# Patient Record
Sex: Female | Born: 1950 | Race: White | Hispanic: No | Marital: Married | Smoking: Former smoker
Health system: Southern US, Community
[De-identification: ages and names within clinical notes are randomized; demographics above are authoritative.]

## PROBLEM LIST (undated history)

## (undated) DIAGNOSIS — E7800 Pure hypercholesterolemia, unspecified: Secondary | ICD-10-CM

## (undated) DIAGNOSIS — F419 Anxiety disorder, unspecified: Secondary | ICD-10-CM

## (undated) DIAGNOSIS — K589 Irritable bowel syndrome without diarrhea: Secondary | ICD-10-CM

## (undated) DIAGNOSIS — D649 Anemia, unspecified: Secondary | ICD-10-CM

## (undated) DIAGNOSIS — K5909 Other constipation: Secondary | ICD-10-CM

## (undated) DIAGNOSIS — M069 Rheumatoid arthritis, unspecified: Secondary | ICD-10-CM

## (undated) DIAGNOSIS — M797 Fibromyalgia: Secondary | ICD-10-CM

## (undated) DIAGNOSIS — K219 Gastro-esophageal reflux disease without esophagitis: Secondary | ICD-10-CM

## (undated) DIAGNOSIS — J189 Pneumonia, unspecified organism: Secondary | ICD-10-CM

## (undated) DIAGNOSIS — Z8489 Family history of other specified conditions: Secondary | ICD-10-CM

## (undated) DIAGNOSIS — M8580 Other specified disorders of bone density and structure, unspecified site: Secondary | ICD-10-CM

## (undated) DIAGNOSIS — R112 Nausea with vomiting, unspecified: Secondary | ICD-10-CM

## (undated) DIAGNOSIS — Z9889 Other specified postprocedural states: Secondary | ICD-10-CM

## (undated) DIAGNOSIS — G2581 Restless legs syndrome: Secondary | ICD-10-CM

## (undated) HISTORY — PX: COLONOSCOPY: SHX174

## (undated) HISTORY — DX: Other specified disorders of bone density and structure, unspecified site: M85.80

## (undated) HISTORY — PX: FOOT SURGERY: SHX648

## (undated) HISTORY — PX: COLON SURGERY: SHX602

## (undated) HISTORY — PX: BREAST SURGERY: SHX581

## (undated) HISTORY — DX: Irritable bowel syndrome, unspecified: K58.9

## (undated) HISTORY — PX: CATARACT EXTRACTION W/ INTRAOCULAR LENS IMPLANT: SHX1309

## (undated) HISTORY — PX: CARPAL TUNNEL RELEASE: SHX101

## (undated) HISTORY — PX: CARDIAC CATHETERIZATION: SHX172

## (undated) HISTORY — PX: ABDOMINAL HYSTERECTOMY: SHX81

## (undated) HISTORY — PX: ABDOMINAL ADHESION SURGERY: SHX90

---

## 1997-12-08 ENCOUNTER — Emergency Department (HOSPITAL_COMMUNITY): Admission: AD | Admit: 1997-12-08 | Discharge: 1997-12-08 | Payer: Self-pay | Admitting: Emergency Medicine

## 1997-12-26 ENCOUNTER — Other Ambulatory Visit: Admission: RE | Admit: 1997-12-26 | Discharge: 1997-12-26 | Payer: Self-pay | Admitting: Obstetrics and Gynecology

## 1998-02-15 ENCOUNTER — Ambulatory Visit (HOSPITAL_COMMUNITY): Admission: RE | Admit: 1998-02-15 | Discharge: 1998-02-15 | Payer: Self-pay | Admitting: *Deleted

## 1998-02-21 ENCOUNTER — Ambulatory Visit (HOSPITAL_COMMUNITY): Admission: RE | Admit: 1998-02-21 | Discharge: 1998-02-21 | Payer: Self-pay | Admitting: *Deleted

## 1998-05-11 ENCOUNTER — Inpatient Hospital Stay (HOSPITAL_COMMUNITY): Admission: RE | Admit: 1998-05-11 | Discharge: 1998-05-15 | Payer: Self-pay | Admitting: *Deleted

## 1999-06-19 ENCOUNTER — Encounter: Payer: Self-pay | Admitting: Family Medicine

## 1999-06-19 ENCOUNTER — Ambulatory Visit (HOSPITAL_COMMUNITY): Admission: RE | Admit: 1999-06-19 | Discharge: 1999-06-19 | Payer: Self-pay | Admitting: Family Medicine

## 2003-07-11 ENCOUNTER — Ambulatory Visit (HOSPITAL_COMMUNITY): Admission: RE | Admit: 2003-07-11 | Discharge: 2003-07-11 | Payer: Self-pay | Admitting: Orthopedic Surgery

## 2004-03-13 ENCOUNTER — Ambulatory Visit (HOSPITAL_BASED_OUTPATIENT_CLINIC_OR_DEPARTMENT_OTHER): Admission: RE | Admit: 2004-03-13 | Discharge: 2004-03-13 | Payer: Self-pay | Admitting: Orthopedic Surgery

## 2006-04-03 ENCOUNTER — Encounter: Admission: RE | Admit: 2006-04-03 | Discharge: 2006-06-03 | Payer: Self-pay | Admitting: Orthopedic Surgery

## 2006-09-16 ENCOUNTER — Other Ambulatory Visit: Admission: RE | Admit: 2006-09-16 | Discharge: 2006-09-16 | Payer: Self-pay | Admitting: Obstetrics and Gynecology

## 2006-10-09 ENCOUNTER — Encounter: Admission: RE | Admit: 2006-10-09 | Discharge: 2006-10-09 | Payer: Self-pay | Admitting: Surgery

## 2006-11-26 ENCOUNTER — Encounter: Payer: Self-pay | Admitting: Emergency Medicine

## 2006-11-26 ENCOUNTER — Inpatient Hospital Stay (HOSPITAL_COMMUNITY): Admission: AD | Admit: 2006-11-26 | Discharge: 2006-12-03 | Payer: Self-pay | Admitting: *Deleted

## 2007-01-22 ENCOUNTER — Ambulatory Visit: Payer: Self-pay | Admitting: Vascular Surgery

## 2007-03-11 ENCOUNTER — Ambulatory Visit: Admission: RE | Admit: 2007-03-11 | Discharge: 2007-03-11 | Payer: Self-pay | Admitting: Family Medicine

## 2008-04-04 IMAGING — CT CT ENTERO ABD W/CM
1 of 5 series · 11 of 32 positions shown, 17 images · IV contrast (VOLUMEN & [ID] OMNI 300)
Comparison: none

CLINICAL DATA: Possible small bowel obstruction.  Severe constipation.  Prior hysterectomy.  Surgery for adhesions.
 CT ENTEROGRAPHY ABDOMEN:
TECHNIQUE: Multidetector CT imaging of the abdomen was performed following administration of 4382 cc of VoLumen low attenuation enteric contrast.  Images were acquired after bolus injection of nonionic intravenous contrast with coronal and sagittal reformations reviewed for assessment of the small bowel mucosa. 
 Contrast:  100 cc Omnipaque 300.  Also patient was given VoLumen for negative enteric contrast.
TECHNIQUE: Multidetector CT imaging of the pelvis was performed following administration of 4382 cc of VoLumen low attenuation enteric contrast.  Images were acquired after bolus injection of nonionic intravenous contrast with coronal and sagittal reformations reviewed for assessment of the small bowel mucosa.

[Series 2: enterography study · axial · 0.74mm/px · z∈[-453,-21]mm · 11 of 203 slices shown, 17 images]
[im 17/203  soft-tissue]
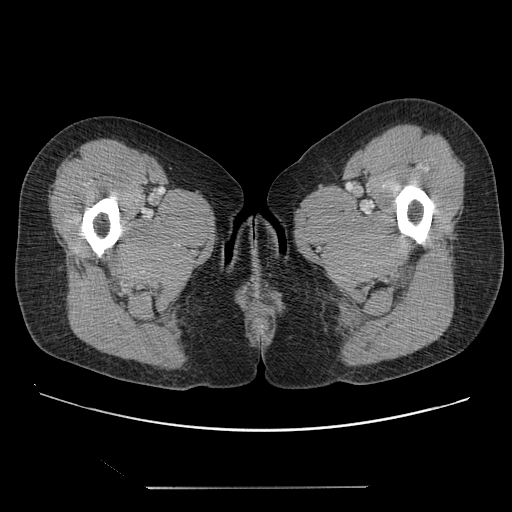
[im 17/203  bone]
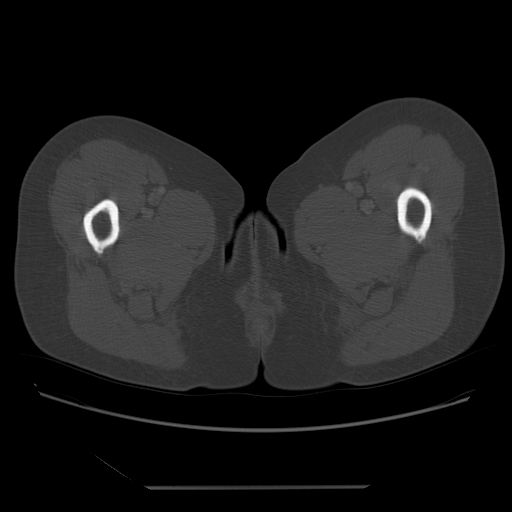
[im 34/203  soft-tissue]
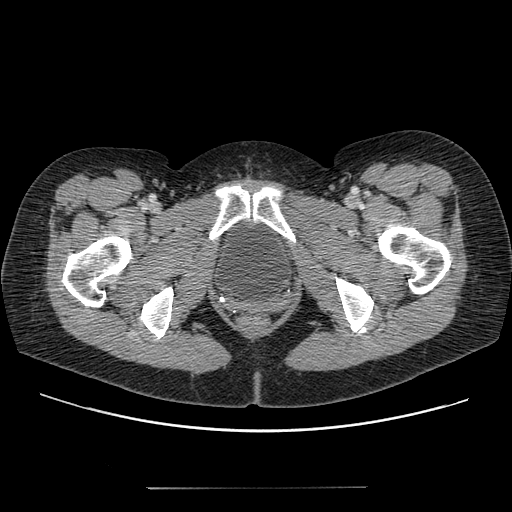
[im 51/203  soft-tissue]
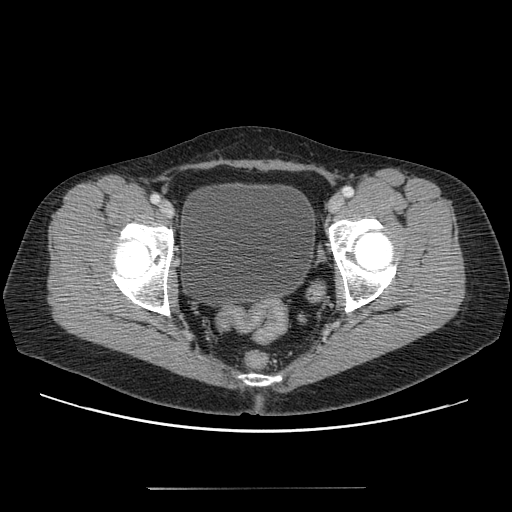
[im 68/203  soft-tissue]
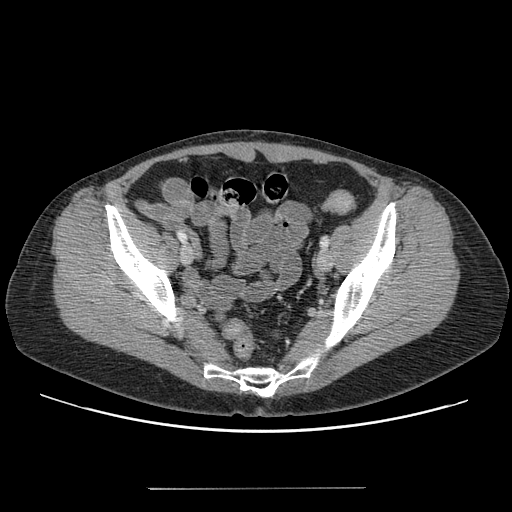
[im 85/203  soft-tissue]
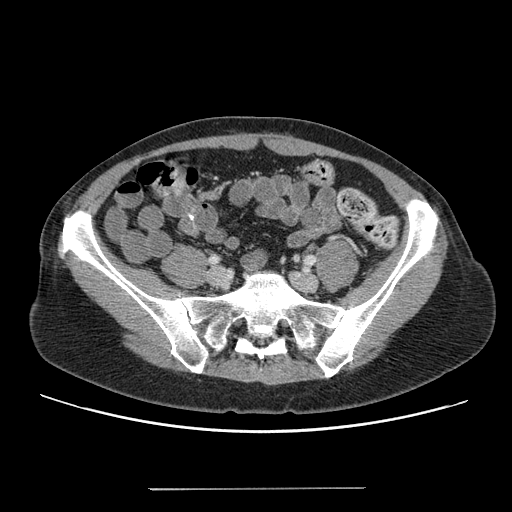
[im 102/203  soft-tissue]
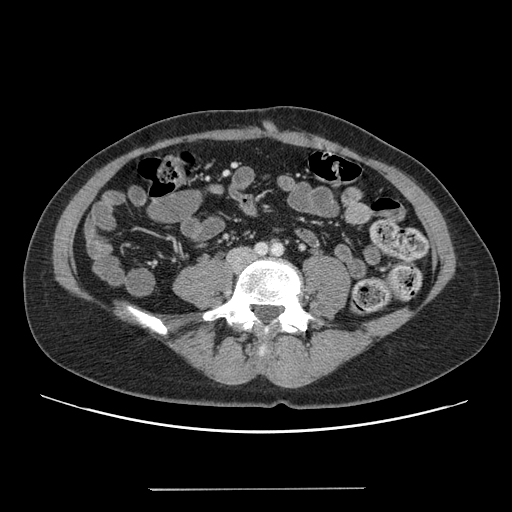
[im 118/203  soft-tissue]
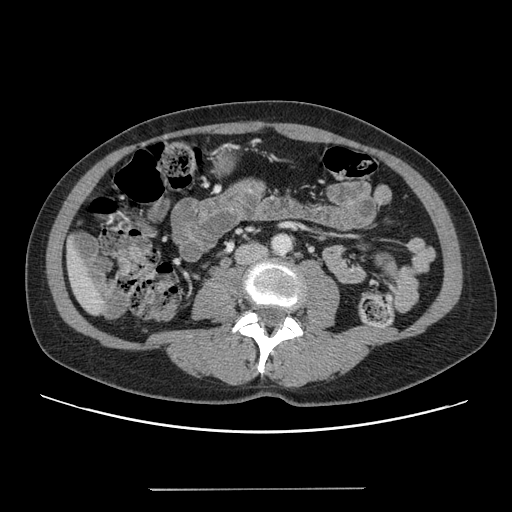
[im 135/203  soft-tissue]
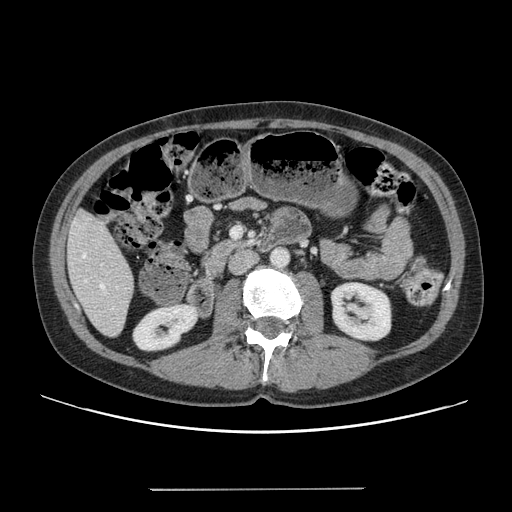
[im 135/203  lung]
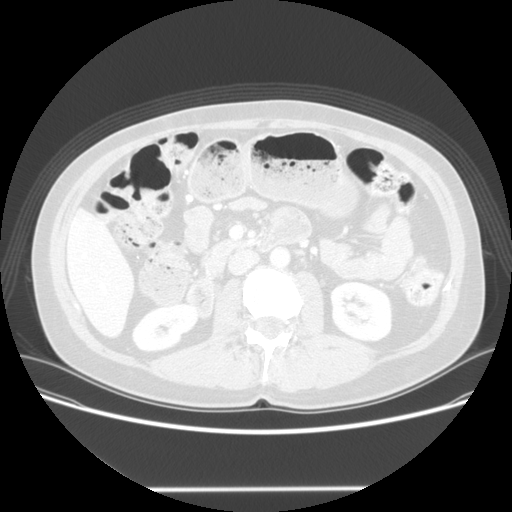
[im 152/203  soft-tissue]
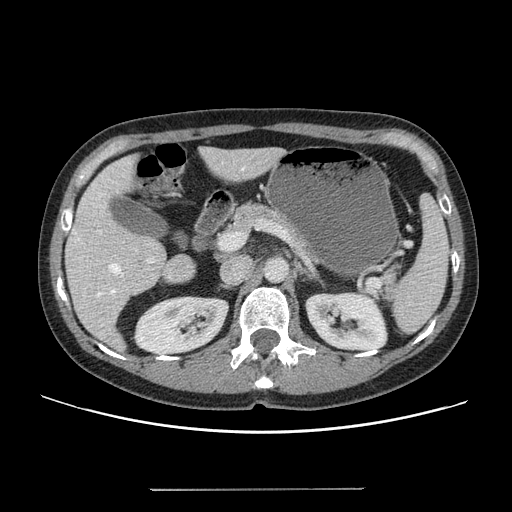
[im 152/203  lung]
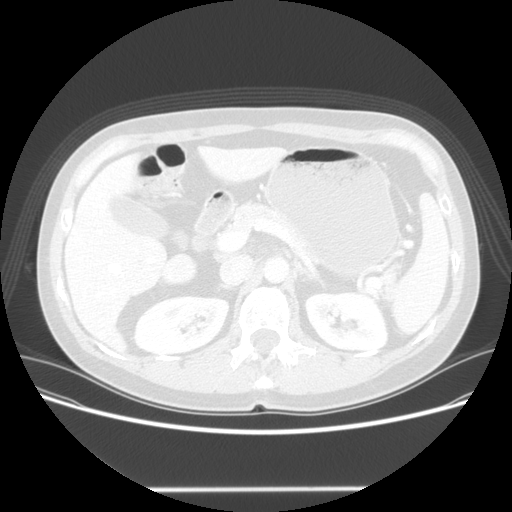
[im 152/203  bone]
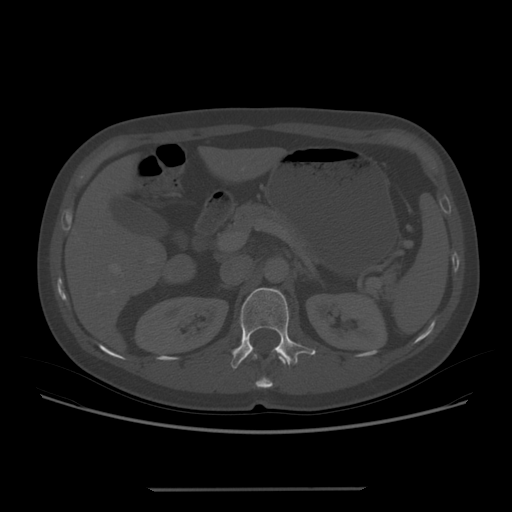
[im 169/203  soft-tissue]
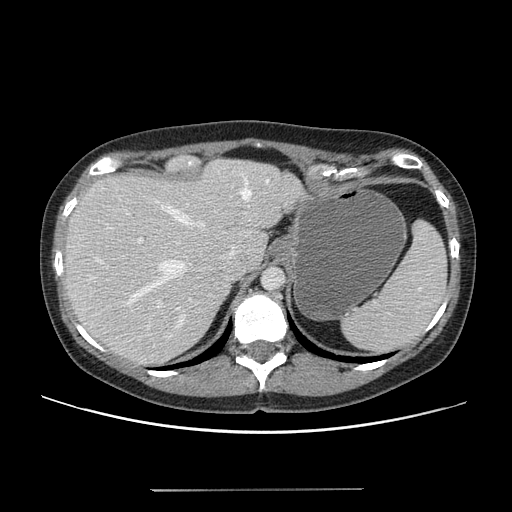
[im 169/203  lung]
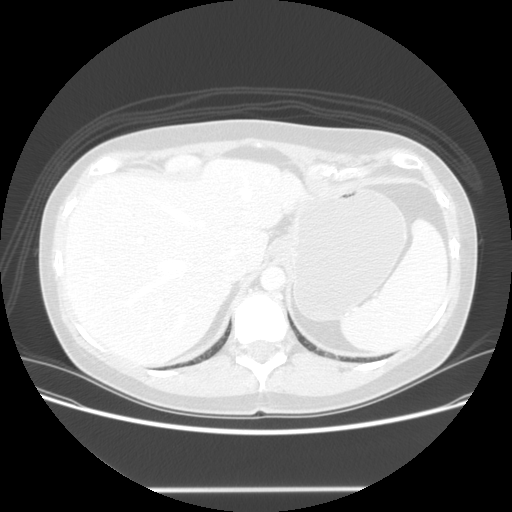
[im 186/203  soft-tissue]
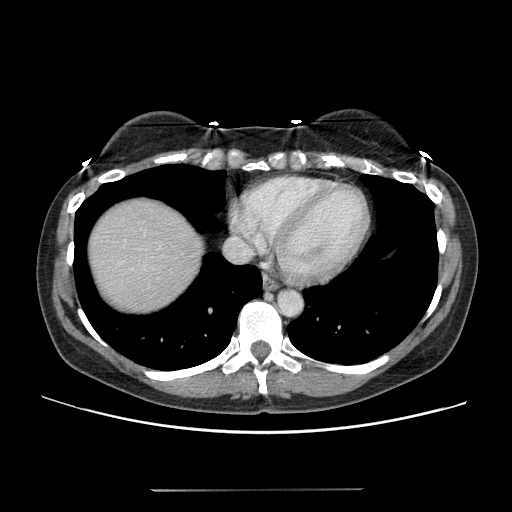
[im 186/203  lung]
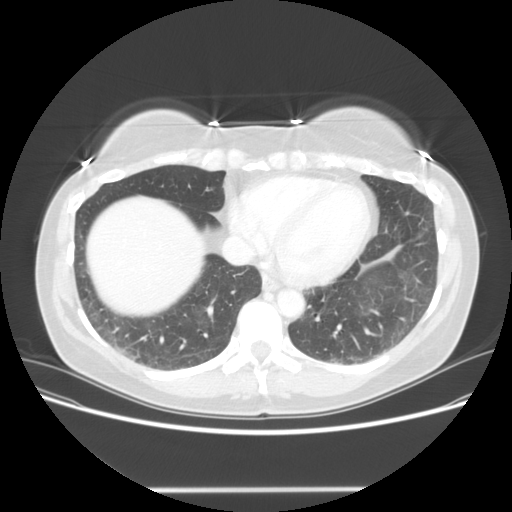

[11 of 32 positions shown; findings below may reference images not displayed]

FINDINGS: The lung bases are clear.  The liver enhances with no focal abnormality and no ductal dilatation is seen.  Only a single small low attenuation structure is noted in the lower aspect of the right lobe which appears to become isodense on delayed images and probably represents a subcentimeter hemangioma.  Gallbladder is grossly normal with no calcified gallstones noted.  The pancreas, adrenal glands, and spleen appear normal.  The kidneys enhance normally and on delayed images the pelvocaliceal systems appear normal.  Abdominal aorta is normal in caliber.  No adenopathy is seen.
 The small bowel appears normal with no evidence of distention.  No small bowel mucosal edema, mural enhancement, or enhancing lesion is seen.
IMPRESSION: Negative CT of the abdomen.  Small bowel is not distended and shows no evidence of mural enhancement. 
 CT ENTEROGRAPHY PELVIS:
FINDINGS: Small bowel is not distended.  There are surgical sutures around small bowel in the right pelvis which may be related to patient?s history of prior Meckel?s diverticulum.  The urinary bladder is unremarkable.  The patient has undergone prior hysterectomy.  No free fluid or mass is seen within the pelvis.  The colon is nondistended.  Just anterior to the urinary bladder in the low anterior pelvis near the midline there are two symmetrical enhancing nodules just to the right and just to the left of the midline.  These do not appear to communicate with a vessel and could be postoperative in nature.  They do appear to be enhancing however.  Is there any history of prior endometriosis in which these could represent implants.  No bony abnormality is seen.
IMPRESSION: 1.  No abnormality of the small bowel is seen.  No distention or mural enhancement is noted. 
 2.  Two symmetrical enhancing nodules subcutaneously in the anterior lower pelvis of questionable significance as described above.

## 2008-05-25 IMAGING — US US ABDOMEN COMPLETE
1 series · 14 of 25 positions shown · non-contrast
Comparison: none

CLINICAL DATA: Abdominal pain.  Nausea.  
 ABDOMEN ULTRASOUND:
TECHNIQUE: Complete abdominal ultrasound examination was performed including evaluation of the liver, gallbladder, bile ducts, pancreas, kidneys, spleen, IVC, and abdominal aorta.

[Series 1: unknown · 0.28mm/px · 14 of 62 slices shown]
[im 1/62]
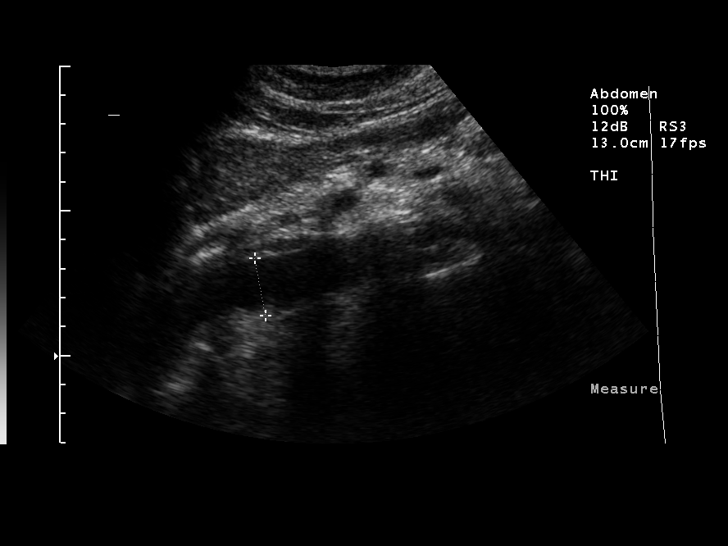
[im 6/62]
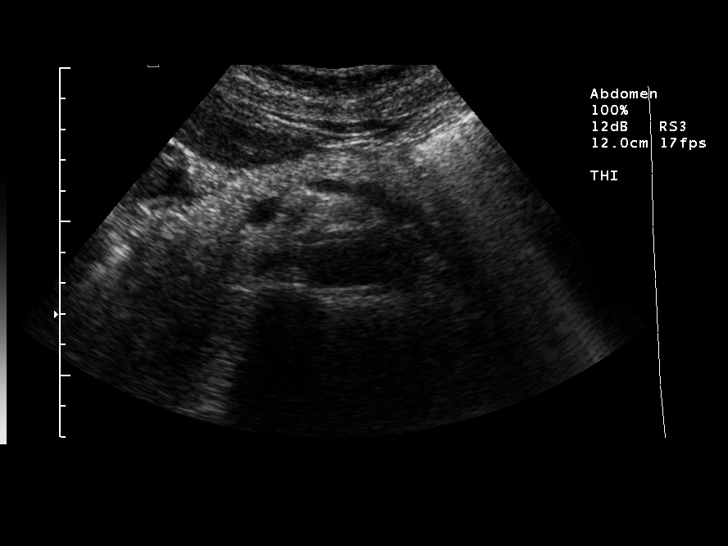
[im 11/62]
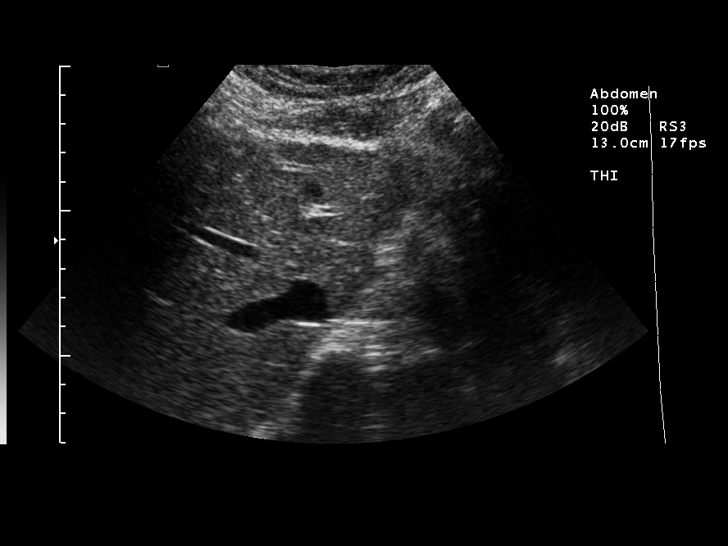
[im 16/62]
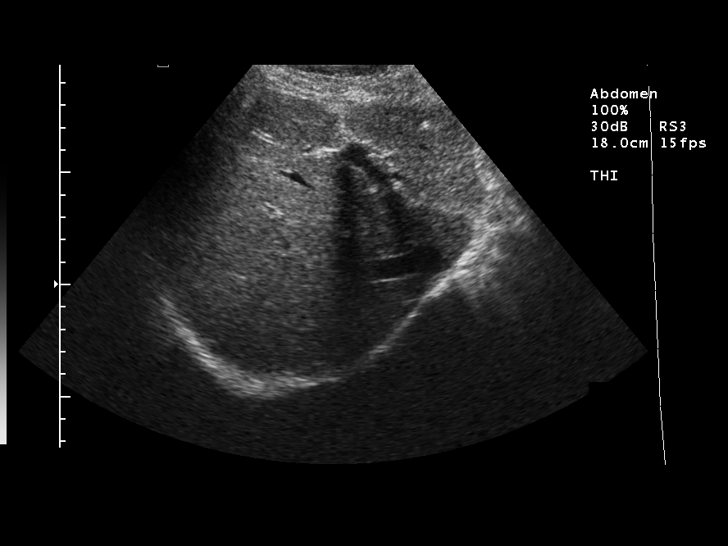
[im 21/62]
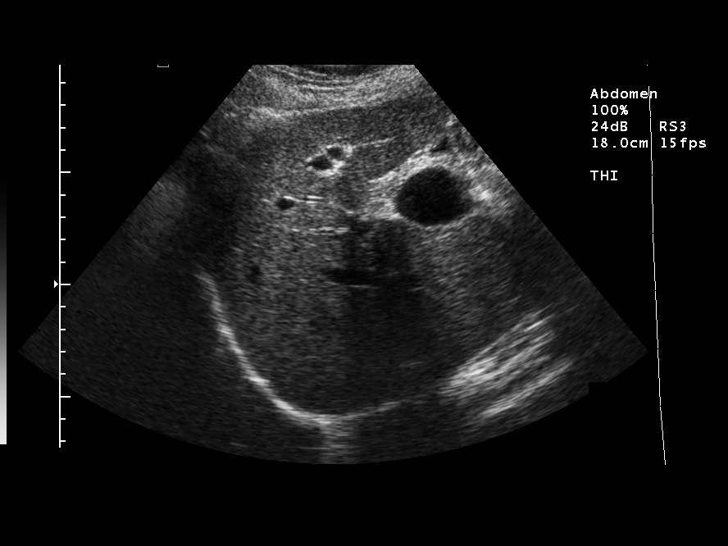
[im 23/62]
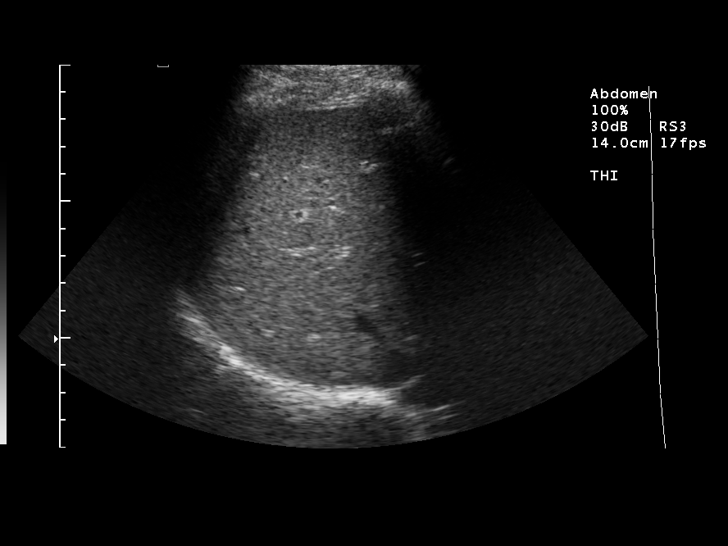
[im 28/62]
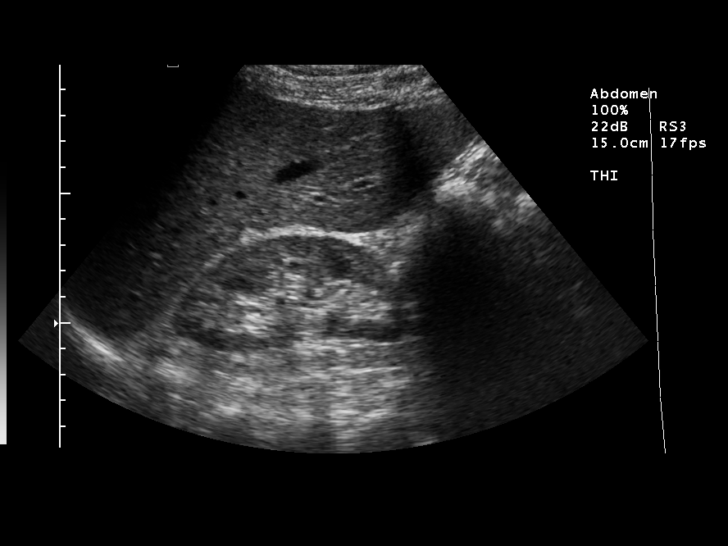
[im 34/62]
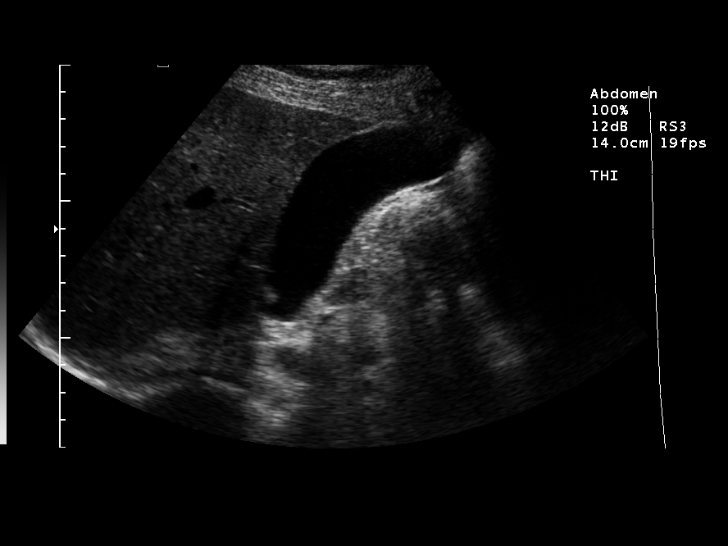
[im 39/62]
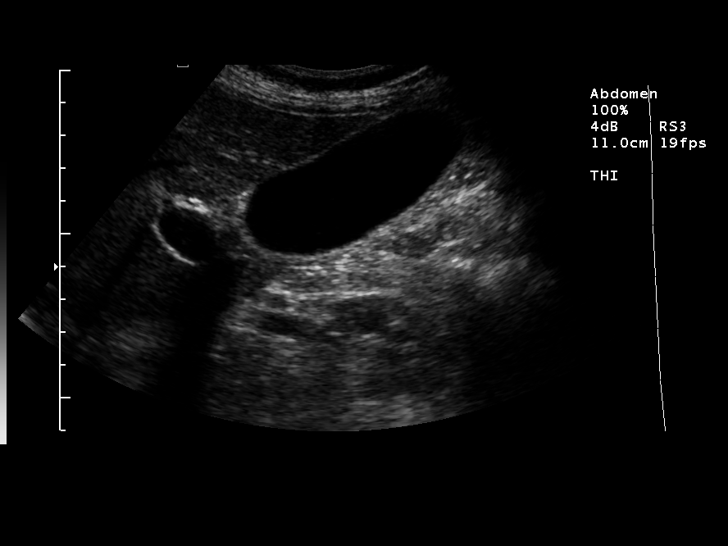
[im 41/62]
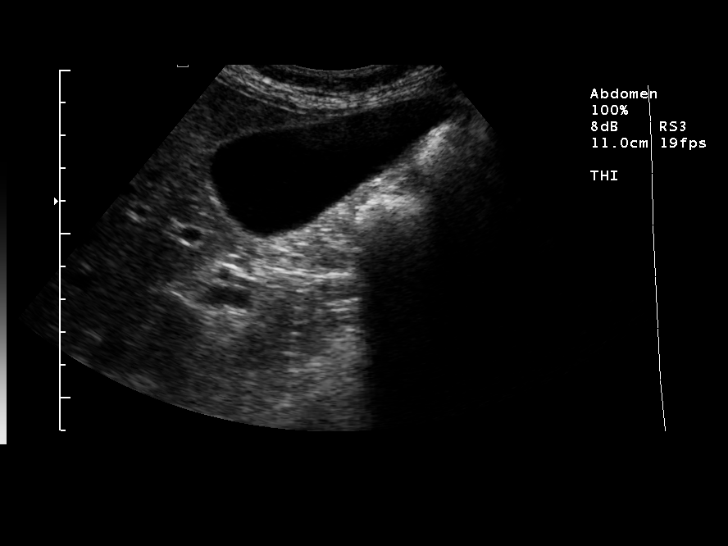
[im 46/62]
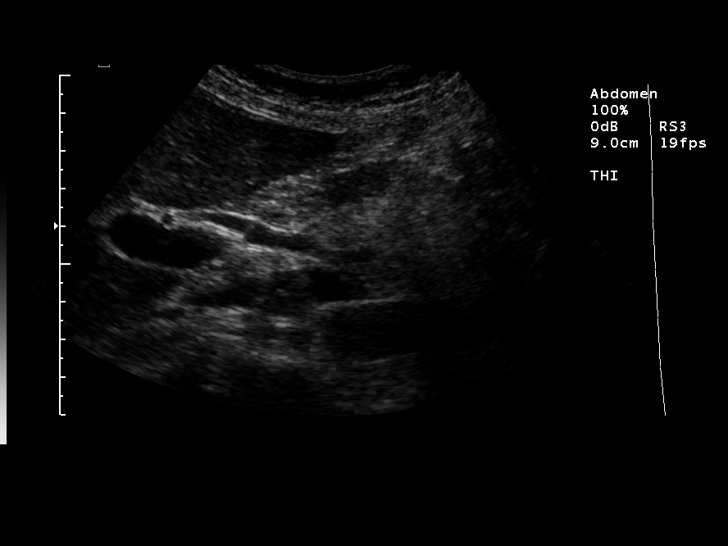
[im 51/62]
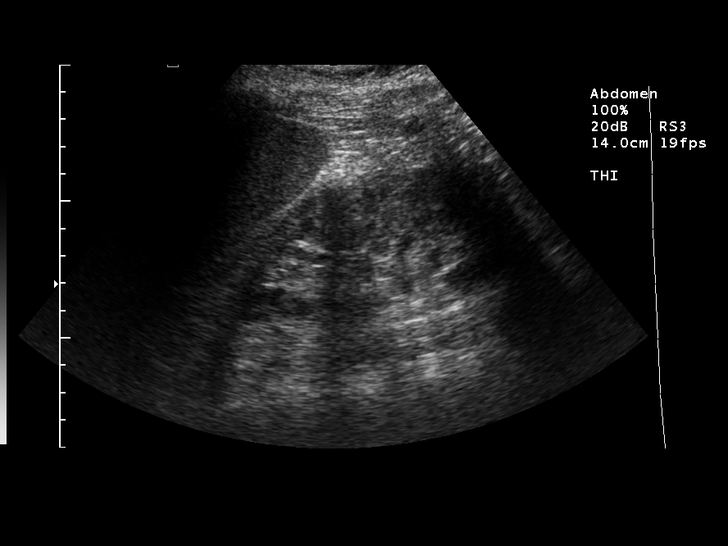
[im 56/62]
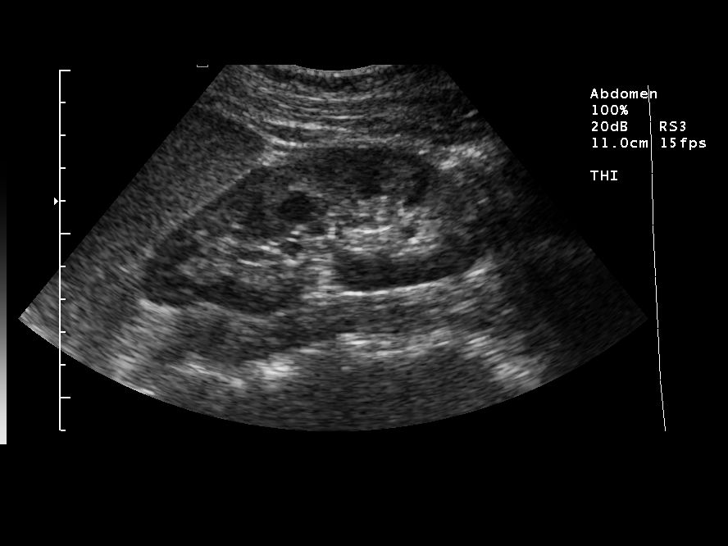
[im 62/62]
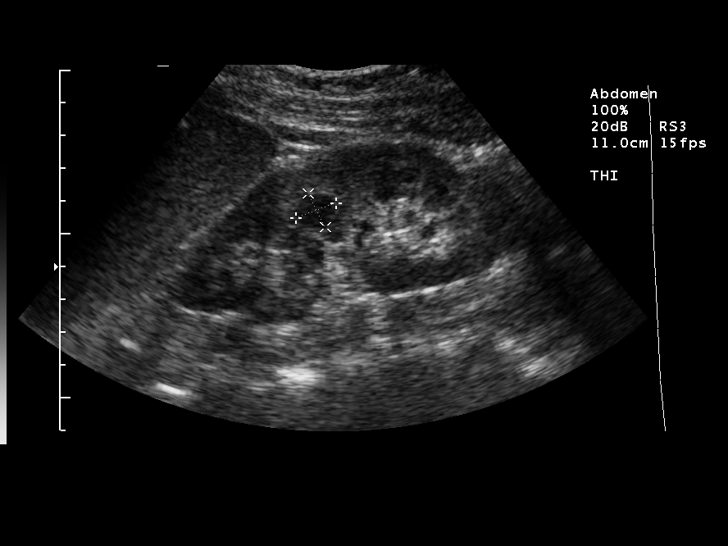

[14 of 25 positions shown; findings below may reference images not displayed]

FINDINGS: The liver is normal without focal lesions or biliary ductal dilatation.  The gallbladder is normal.  The common duct is normal at 4 mm.  The pancreas is normal.  The spleen is normal.  Both kidneys are normal with the right measuring 10 cm and left measuring 10.4 cm.  The aorta and IVC are normal.  No free fluid.
IMPRESSION: Normal exam.

## 2009-09-20 ENCOUNTER — Other Ambulatory Visit: Admission: RE | Admit: 2009-09-20 | Discharge: 2009-09-20 | Payer: Self-pay | Admitting: Obstetrics and Gynecology

## 2009-09-20 ENCOUNTER — Ambulatory Visit: Payer: Self-pay | Admitting: Obstetrics and Gynecology

## 2010-01-26 ENCOUNTER — Inpatient Hospital Stay (HOSPITAL_COMMUNITY): Admission: AD | Admit: 2010-01-26 | Discharge: 2010-01-27 | Payer: Self-pay | Admitting: Urology

## 2010-01-29 ENCOUNTER — Emergency Department (HOSPITAL_BASED_OUTPATIENT_CLINIC_OR_DEPARTMENT_OTHER): Admission: EM | Admit: 2010-01-29 | Discharge: 2010-01-29 | Payer: Self-pay | Admitting: Emergency Medicine

## 2010-09-23 ENCOUNTER — Encounter: Payer: Self-pay | Admitting: *Deleted

## 2010-09-23 ENCOUNTER — Encounter: Payer: Self-pay | Admitting: Gastroenterology

## 2010-09-23 ENCOUNTER — Encounter: Payer: Self-pay | Admitting: Surgery

## 2010-09-25 ENCOUNTER — Ambulatory Visit
Admission: RE | Admit: 2010-09-25 | Discharge: 2010-09-25 | Payer: Self-pay | Source: Home / Self Care | Attending: Obstetrics and Gynecology | Admitting: Obstetrics and Gynecology

## 2010-09-25 ENCOUNTER — Other Ambulatory Visit: Payer: Self-pay | Admitting: Obstetrics and Gynecology

## 2010-09-25 ENCOUNTER — Other Ambulatory Visit
Admission: RE | Admit: 2010-09-25 | Discharge: 2010-09-25 | Payer: Self-pay | Source: Home / Self Care | Admitting: Obstetrics and Gynecology

## 2010-11-19 LAB — URINALYSIS, ROUTINE W REFLEX MICROSCOPIC
Bilirubin Urine: NEGATIVE
Glucose, UA: NEGATIVE mg/dL
Ketones, ur: NEGATIVE mg/dL
pH: 7 (ref 5.0–8.0)

## 2010-11-19 LAB — URINE MICROSCOPIC-ADD ON

## 2010-11-19 LAB — CBC
HCT: 32.6 % — ABNORMAL LOW (ref 36.0–46.0)
Hemoglobin: 11.1 g/dL — ABNORMAL LOW (ref 12.0–15.0)
MCV: 89.1 fL (ref 78.0–100.0)
MCV: 89.3 fL (ref 78.0–100.0)
RBC: 3.66 MIL/uL — ABNORMAL LOW (ref 3.87–5.11)
RBC: 4.41 MIL/uL (ref 3.87–5.11)
WBC: 5.7 10*3/uL (ref 4.0–10.5)
WBC: 8.2 10*3/uL (ref 4.0–10.5)

## 2010-11-19 LAB — TYPE AND SCREEN: ABO/RH(D): A POS

## 2010-11-19 LAB — CREATININE, SERUM: GFR calc Af Amer: >60 mL/min (ref >60)

## 2010-11-19 LAB — URINE CULTURE: Colony Count: 35000

## 2010-11-19 LAB — PROTIME-INR: Prothrombin Time: 13.2 seconds (ref 11.6–15.2)

## 2010-11-19 LAB — ABO/RH: ABO/RH(D): A POS

## 2010-11-19 LAB — HEMOGLOBIN AND HEMATOCRIT, BLOOD: Hemoglobin: 12 g/dL (ref 12.0–15.0)

## 2010-11-19 LAB — APTT: aPTT: 26 seconds (ref 24–37)

## 2011-01-18 NOTE — Consult Note (Signed)
Wendy [name], Wendy [name]                  ACCOUNT NO.:  [id number]   MEDICAL RECORD NO.:  [id number]          PATIENT TYPE:  INP   LOCATION:  5710                         FACILITY:  [facility]   PHYSICIAN:  Currie Paris, M.D.DATE OF BIRTH:  12-20-1950   DATE OF CONSULTATION:  11/27/2006  DATE OF DISCHARGE:                                 CONSULTATION   PHYSICIANS:  Gastroenterologist, Dr. Evette Cristal.  Primary care physician, Dr. Tiburcio Pea.   REASON FOR CONSULTATION:  Abdominal pain.   HISTORY OF PRESENT ILLNESS:  Wendy [name] is a 45 female patient with  history of prior surgeries, hysterectomy for endometriosis, subsequent  open abdominal procedure with midline laparotomy incision to repair  apparent adhesions, possible small-bowel obstruction at that time, also  an apparent repair of a Meckel's diverticulum at that time.  Prior to  this procedure, the patient had developed symptoms of progressive  abdominal pain, distension, constipation and reflux.  After the surgical  procedure, the symptoms were completely resolved and the patient has not  had any symptoms like this and has had a normal bowel movement pattern  until about 6 months ago when these symptoms began to progressively  recur.  She apparently has followed up with gastroenterologist during  this time.  She has also seen Dr. Daphine Deutscher with surgery.  He was uncertain  if surgery was indicated based on the patient's clinical findings.  He  did have the patient undergo a CT scan with small bowel follow-through  which was normal and this included normal small bowel wall and no small  bowel distension.  No evidence of any type of stricturing, etc.  She was  admitted by the internist on November 26, 2006 because of recurrent  abdominal pain, bloating, constipation, and intolerance to solid food.  The patient reports to me that she has subsisted on a liquid diet since  February 2008.   Yesterday on admission, a CT scan was performed with oral  contrast and  was essentially within normal limits except for constipation.  Plain  films were also normal.  The patient has been complaining of severe  burning and reflux-type symptoms after eating.  These have been lessened  with the use of the liquid diet.  Since admission she reports multiple  episodes of emesis.  Initially the emesis was clear.  Now is more of a  brownish coffee-ground appearance with a bad taste in her mouth.  Because of the above problems, surgical consultation has been requested.   REVIEW OF SYSTEMS:  As per the history present illness, the patient is  very insistent that the symptoms are identical to the symptoms prior to  her repair of adhesions and she feels that operative procedure would be  beneficial and the only treatment.  The patient again states she has  been subsisting on organic juices, mainly because of intolerance to  solid diet.  She states that she was given an enema last night and has  not had any stool and has been taking Colace since to try to promote  bowel movement.  Prior to admission she  apparently was having nausea but  no vomiting.  This changed after admission.  Apparently before last  surgery for adhesions, she was not having any nausea, vomiting or other  symptoms that are more consistent with a bowel obstruction pattern.   SOCIAL HISTORY:  No alcohol.  No tobacco.  She is married.   PAST MEDICAL HISTORY:  1. Fibromyalgia.  2. Prior endometriosis.  3. Migraine headaches.   PAST SURGICAL HISTORY:  1. Hysterectomy.  2. Exploratory laparotomy for lysis of adhesions and apparent repair      of Meckel's diverticulum.   ALLERGIES:  No known drug allergies.   CURRENT MEDICATIONS:  Home medications included Topamax, Elavil,  potassium supplementation, and multiple vitamins.   PHYSICAL EXAMINATION:  GENERAL:  Anxious female patient, rocking in the  bed, complaining of significant abdominal pain for 6 months.  VITAL SIGNS:   Temperature 97.9, BP 137/84, pulse 84 and regular,  respirations 20.  NEURO:  The patient is alert, oriented x3, moving all extremities x4.  No focal deficits.  HEENT:  Head normocephalic, sclera noninjected.  NECK:  Supple.  No adenopathy.  CHEST:  Bilateral lung sounds, clear to auscultation.  Respiratory  effort is nonlabored.  She is on room air.  CARDIAC:  S1 S2.  No rubs, murmurs or gallops.  Pulses regular.  Nontachycardiac.  No JVD.  ABDOMEN:  Soft, slightly bloated.  Bowel sounds are present.  She is  mildly tender without guarding in the lower abdomen.  She has a vertical  incision which is well healed without evidence of any herniation.  She  has very large low Pfannenstiel  incision that extends from the right  hip to the left hip and again there are no hernias noted in this region.  EXTREMITIES:  Symmetrical in appearance without edema, cyanosis or  clubbing.   LABORATORY DATA:  Phosphorus 2.5, magnesium 1.9.  Sodium 137, potassium  3.3, CO2 24, glucose 135, BUN 4, creatinine 0.79.  LFTs are normal.  White count 7600, hemoglobin 12.3, platelets 152,000, lipase 30.  Diagnostic CT of the abdomen and pelvis with oral contrast as well as  plain abdominal x-rays are normal except for showing evidence of  constipation.   IMPRESSION:  1. Abdominal pain with associated reflux symptoms, rule out atypical      presentation of adhesions versus dysmotility disorder versus severe      hiatal hernia.  2. Prior abdominal surgery as described.  3. History of fibromyalgia and migraine headaches.   PLAN:  1. Agree with EGD.  Uncertain if colonoscopy indicated if EGD should      this shows no etiology to symptoms.  2. Suspect the patient may have more of a motility issue or severe      reflux, but again need to follow up on EGD      results.  If she does have a dysmotility issue, uncertain that the     etiology is a primary issue such as a neurogenic basis versus      medication side  effects since the patient is on chronic doses of      Topamax and Elavil.  3. We will follow along with you.      Allison L. Rennis Harding, N.P.      Currie Paris, M.D.  Electronically Signed    ALE/MEDQ  D:  11/27/2006  T:  11/27/2006  Job:  [id number]   cc:   Graylin Shiver, M.D.

## 2011-01-18 NOTE — Consult Note (Signed)
Wendy [name], Wendy [name]                  ACCOUNT NO.:  [id number]   MEDICAL RECORD NO.:  [id number]          PATIENT TYPE:  INP   LOCATION:  5710                         FACILITY:  [facility]   PHYSICIAN:  Shirley Friar, MDDATE OF BIRTH:  [birth year]   DATE OF CONSULTATION:  11/27/2006  DATE OF DISCHARGE:                                 CONSULTATION   REQUESTING PHYSICIAN:  Barnetta Chapel, M.D.   REASON FOR CONSULTATION:  Abdominal pain, vomiting.   HISTORY OF PRESENT ILLNESS:  This patient was seen in request of Dr.  Dartha Lodge in regards to the patient's abdominal pain and vomiting.  Mrs.  [name] is a 60 year old white female with a history of abdominal  adhesions, exploratory surgery and lysis of adhesions approximately  seven years ago.  She reports a several-month history of constant  abdominal pain without any known triggers that occurs all over her  abdomen.  She also has been having intermittent nausea at home and onset  of some vomiting in the hospital.  She saw Dr. Evette Cristal in the office on  November 24, 2006, and at that time she described a sensation of food  sitting in her stomach after she would eat, this having gone on for a  couple months.  She does have a chronic history of constipation normally  occurring about once per week, but it has slowed down in the last few  months.  She had been using MiraLax and enemas with minimal relief.  She  reports that Dr. Oletha Blend did a lysis of adhesions approximately seven  years ago, and she says that she saw Dr. Daphine Deutscher for an opinion regarding  her abdominal pain to discuss possible surgery.  She states that he put  her on a vegan diet and had her follow-up, but her pain has persisted  and she came in the hospital due to worsened abdominal pain and  inability to eat.  A CAT scan was done which was negative for  obstruction but did show constipation.  Of note, she had a small bowel  enteroscopy done in February,  ordered by Dr. Daphine Deutscher,  which showed no  small bowel distension and no wall enhancement.   PAST MEDICAL HISTORY:  1. Constipation as stated above.  2. History of adhesions.  3. Fibromyalgia.  4. Migraines.  5. Restless leg syndrome.  6. History of endometriosis, status post hysterectomy.   MEDICATIONS:  Topamax, amitriptyline, MiraLax as needed.   ALLERGIES:  No known drug allergies.   PHYSICAL EXAMINATION:  VITAL SIGNS:  The temperature 97.9, pulse 84,  blood pressure 137/84, oxygen saturation 99% on room air.  GENERAL:  Alert, moderate acute distress, tearful at times.  HEENT:  Nonicteric sclerae.  ABDOMEN:  Healed midline surgical scar.  Soft.  Tenderness throughout  especially in the epigastric region.  Nondistended.  Positive bowel  sounds.  Positive guarding.  RECTAL:  Digital rectal exam was revealed tenderness but no masses  palpated.   IMPRESSION:  This is a 60-year white female with chronic abdominal pain,  weight loss described as 22 pounds in the  past two months and  intermittent nausea and vomiting.  The patient had a colonoscopy done in  February 2007 which was unremarkable except for some erythematous mucosa  in the rectum.  Recent imaging of her small bowel was negative for wall  enhancement and no small bowel distension to suggest a chronic  dysmotility disorder.  She could have slow transit of her colon causing  constipation and contributing to her worsened abdominal pain and nausea.  There is no evidence of small bowel obstruction causing her pain.  She  does need to have an upper endoscopy to evaluate for gastric outlet  obstruction and will need to have a gastric emptying study to look for  gastroparesis.  I doubt there is any role for surgical exploration at  this point but would defer final decision on that to surgery since the  patient is requesting them to see her as well.  Hopefully, when we get  her bowels moving better, her pain will subside.  But in the meantime we  will  make she does not have gastric outlet obstruction or delayed  gastric emptying.   PLAN:  1. An EGD in a.m.Marland Kitchen  Risks and benefits discussed including bleeding,      perforation risk of 09/998, and sedation risk.  2. Gastric emptying study.  3. Soapsuds enemas.      Shirley Friar, MD  Electronically Signed     VCS/MEDQ  D:  11/27/2006  T:  11/27/2006  Job:  [id number]   cc:   Graylin Shiver, M.D.  [place] Daphine Deutscher, MD

## 2011-01-18 NOTE — H&P (Signed)
Wendy [name], Wendy [name]                  ACCOUNT NO.:  [id number]   MEDICAL RECORD NO.:  [id number]          PATIENT TYPE:  EMS   LOCATION:  ED                           FAC[facility]   PHYSICIAN:  Lucita Ferrara, MD         DATE OF BIRTH:  26-Oct-1950   DATE OF ADMISSION:  11/26/2006  DATE OF DISCHARGE:                              HISTORY & PHYSICAL   CHIEF COMPLAINT:  Severe abdominal pain and inability to tolerate p.o.  intake.   The patient is a 60 year old female with a longstanding history of small  bowel obstruction, status post abdominal surgery and adhesions 10 years  ago.  The patient apparently had a hysterectomy at that time secondary  to endometriosis.  The patient has been getting recurrence of her  abdominal pain secondary to what the family thinks is from adhesions  that she has had.  The patient denies any vomiting but is nauseated.  The patient also denies any diarrhea or constipation.  Denies any fevers  or chills.  Of note, the patient has had several consultations on an  outpatient basis from (1) Dr. Jamey Ripa and (2) Dr. Daphine Deutscher from surgery who  have suggested that an acute procedure is not indicated, as explained by  the family.  The patient also notes that she has not had any solid food  since the beginning of February 2008.  The patient states that she has  not had any bowel movements in the last 5 days.  She used an enema last  night, without any relief.  The patient, apparently when she presented  to the emergency room, was moaning in pain.   PAST MEDICAL HISTORY:  1. As above.  2. Hysterectomy.  3. History of endometriosis.  4. History of questionable small bowel obstruction.  5. History of Meckel's diverticulum.  6. History of fibromyalgia.  7. Migraine headaches.   MEDICATIONS:  The patient is currently taking Topamax and amitriptyline  for migraines and for fibromyalgia.   ALLERGIES:  No known drug allergies.   PAST SURGICAL HISTORY:  1. As above.  2. In addition, the patient also had lysis of adhesions through      laparoscopy about 10 years ago.  This was apparently done by Dr.      Dewayne Hatch.   PHYSICAL EXAMINATION:  GENERAL:  The patient is in no acute distress.  The patient has recently received pain medication.  She does not seem in  any pain.  She is sleeping and very comfortable.  HEENT:  Normocephalic, atraumatic.  Sclerae anicteric.  Mucous membranes  moist.  PERRLA.  Extraocular movements intact.  NECK:  Supple.  No JVD.  No carotid bruits.  CARDIOVASCULAR:  S1, S2.  Regular rate and rhythm.  No murmurs, rubs, or  clicks.  ABDOMEN:  Soft, nontender.  Surgical scar is visible from prior  abdominal surgery.   White count 5, hemoglobin 12.2, hematocrit 35.5, platelet count is  normal.  Otherwise, other labs are within normal limits.  BUN is 11, and  creatinine 1.1, potassium low at 3.3, lipase  30.  ACLT within normal  limits.  X-ray of the abdomen:  Nonspecific nonobstructive bowel gas  pattern.   ASSESSMENT AND PLAN:  This is a 60 year old female with a past medical  history of recurrent abdominal pain possibly secondary to adhesions.  The patient has already had evaluation and recommendation by other  surgeons, who suggested that it is not indicated to perform lysis of  adhesions at this time.  The patient's family is very worried and  concerned about her, as her p.o. intake has gone down.  They state that  her weight has also decreased.  We will go ahead and admit, keep the  patient n.p.o. until CT scan results are back.  If indicated, will get a  surgical consult.  If indicated, will get NG tube to low intermittent  suction.  The patient does not have a white count and does not seem like  an acute or surgical abdomen at this present time.  Will control the  pain as needed.  Will hydrate the patient as needed.  Will perhaps get a  nutritional consult to make sure that the patient is on the right type  of  diet.      Lucita Ferrara, MD  Electronically Signed     RR/MEDQ  D:  11/26/2006  T:  11/26/2006  Job:  [id number]   cc:   Holley Bouche, M.D.  Fax: [phone]

## 2011-01-18 NOTE — Op Note (Signed)
NAME:  Wendy [name], Wendy [name]                       ACCOUNT NO.:  [id number]   MEDICAL RECORD NO.:  [id number]                   PATIENT TYPE:  AMB   LOCATION:  DSC                                  FACILITY:  [facility]   PHYSICIAN:  Leonides Grills, M.D.                  DATE OF BIRTH:  02/15/1951   DATE OF PROCEDURE:  03/13/2004  DATE OF DISCHARGE:                                 OPERATIVE REPORT   PREOPERATIVE DIAGNOSIS:  Right hallux rigidus.   POSTOPERATIVE DIAGNOSIS:  Right hallux rigidus.   OPERATION PERFORMED:  Right great toe cheilectomy.   SURGEON:  Leonides Grills, M.D.   ASSISTANT:  Lianne Cure, P.A.   ANESTHESIA:  Ankle block with sedation.   ESTIMATED BLOOD LOSS:  Minimal.   TOURNIQUET TIME:  Approximately half hour.   COMPLICATIONS:  None.   DISPOSITION:  Stable to PR.   INDICATIONS FOR PROCEDURE:  The patient is a 60 year old female who has had  longstanding right great toe pain that was interfering with her life to the  point that she cannot do what she wants to do.  The patient  has consented  for the above procedure.  All risks which include infection, neurovascular  injury, persistent pain, worsening pain, stiffness, arthritis and possible  fusion were all explained, questions were encouraged and answered.   DESCRIPTION OF PROCEDURE:  The patient was brought to the operating room and  placed in supine position after adequate ankle block with sedation was  administered as well as Ancef 1 g IV piggyback.  The right lower extremity  was then prepped and draped in sterile manner over a proximally placed thigh  tourniquet.  The limb was gravity exsanguinated and tourniquet was elevated  to 290 mmHg. A longitudinal incision just medial to the EHL tendon was then  made.  Dissection was carried down through skin.  Hemostasis was obtained.  The EHL tendon was protected and undisturbed within its tendon sheath and a  longitudinal capsulotomy was then made over the great  toe MTP joint.  There  was a large spur in this area with a large amount of synovitis.  The spur  was then removed.  Also of note, approximately one third of the cartilage  surface involving the metatarsal head was completely denuded of cartilage  and there was a large overhanging osteophyte off the proximal phalanx based  dorsally that was debrided osteophytes as well as with a rongeur. All bony  edges were then rongeured as well, so that it was nice and flush and smooth  underneath the skin surface.  The joint was copiously irrigated with normal  saline.  Tourniquet was deflated,  hemostasis was obtained, The range of motion of the joint was significantly  improved, especially in dorsiflexion.  The capsule was closed with 3-0  Vicryl suture.  Skin was closed with 4-0 nylon suture.  Sterile dressing was  applied.  The  patient was stable to the PR.                                               Leonides Grills, M.D.    PB/MEDQ  D:  03/13/2004  T:  03/13/2004  Job:  [id number]

## 2011-01-18 NOTE — Op Note (Signed)
Wendy [name], Wendy [name]                  ACCOUNT NO.:  [id number]   MEDICAL RECORD NO.:  [id number]          PATIENT TYPE:  INP   LOCATION:  5710                         FACILITY:  [facility]   PHYSICIAN:  Shirley Friar, MDDATE OF BIRTH:  19-Oct-1950   DATE OF PROCEDURE:  DATE OF DISCHARGE:  11/26/2006                               OPERATIVE REPORT   PROCEDURE:  Upper endoscopy.   INDICATION:  Abdominal pain, vomiting.   MEDICATIONS:  Fentanyl 50 mcg IV, Versed 4 mg IV, Phenergan 12.5 mg IV.   FINDINGS:  The endoscope was inserted through the oropharynx and the  esophagus was intubated.  The mid and distal portion esophagus revealed  circumferential superficial ulcerations and erythema consistent with  mild-to-moderate erosive esophagitis.  The endoscope was advanced down  into the stomach which revealed linear erythematous streaks in the  fundus without any mass or ulcer seen.  The endoscope was advanced down  to the antrum which revealed two punctate hemorrhages in the antrum  consistent with superficial erosions.  Retroflexion was done which  revealed normal cardia, fundus and angularis.  There was a large amount  of food particles in the fundus of the stomach.  There was decreased  motility noted in the stomach during endoscopy.  The endoscope was  advanced into the duodenal bulb and the second portion of the duodenum.  The papilla was noted be prominent but there was no evidence of any  ulcerated or erythematous tissue around the papilla.  On careful  withdrawal of the upper endoscope, the second portion of the duodenum  and duodenal bulb were otherwise unremarkable.   ASSISTANT:  1. Mild-to-moderate erosive esophagitis, likely due to persistent      vomiting.  2. Gastric erosions.  3. Food particles in the stomach, question delayed gastric emptying.   PLAN:  1. PPI proton pump inhibitor IV every 12 hours times 24 hours and p.o.      twice daily.  2. Carafate slurry.  3.  Gastric emptying tests.      Shirley Friar, MD  Electronically Signed     VCS/MEDQ  D:  11/28/2006  T:  11/28/2006  Job:  [id number]

## 2011-01-18 NOTE — Discharge Summary (Signed)
NAMEAIXA, CORSELLO                  ACCOUNT NO.:  [id number]   MEDICAL RECORD NO.:  [id number]          PATIENT TYPE:  INP   LOCATION:  5710                         FACILITY:  [facility]   PHYSICIAN:  Andres Shad. Rudean Curt, MD     DATE OF BIRTH:  May 26, 1951   DATE OF ADMISSION:  11/26/2006  DATE OF DISCHARGE:  12/03/2006                               DISCHARGE SUMMARY   DISCHARGE DIAGNOSES:  1. Abdominal pain.  2. Delayed gastrointestinal motility.   DISCHARGE MEDICATIONS:  1. Amitiza 24 mcg twice daily.  2. Trazodone 50 mg before bed.  3. Cafergot as needed.  4. MiraLax if needed for constipation.  5. Topamax.   CONSULTATIONS:  Dr. Jamey Ripa, general surgery and Dr. Randa Evens with  gastroenterology.   SIGNIFICANT STUDIES:  1. Gastrograft and enema on April 1, which showed no constipation.  2. Gastric emptying study on November 28, 2006, which showed a borderline      low gastromotility.  3. CT scan of the abdomen and pelvis on November 26, 2006, which was      significant for constipation.  4. Upper endoscopy was performed on November 26, 2006, which showed mild      to moderate esophagitis and gastric erosions, likely due to      persistent vomiting.   SUMMARY OF HOSPITALIZATION:  Ms. Bickle is a 60 year old female with past  medical history significant for prior abdominal surgeries, fibromyalgia  and migraine headaches, who was admitted to the hospital on November 26, 2006, complaining of severe abdominal pain.  She was initially evaluated  by both gastroenterology and surgery with a question of adhesive disease  versus a known Meckel diverticulum.  An EGD performed, early in the  hospitalization, revealed some esophagitis and gastritis, likely from  vomiting, as well as retained food particles in her stomach.  This led  to a gastric emptying study, which revealed delayed gastric emptying  just at the lower limit of normal.  She was managed with IV hydration,  antiemetics, pain medications and  received a number of oral laxatives to  treat constipation.  Surgery felt that there were no clear surgical  issues revealed by her history or her imaging.  Eventually, it was  decided to begin to hold all medications that were likely delaying her  GI motility including narcotic pain medications and her amitriptyline,  which she was taking for sleep.  This appeared to resolve and  significant improvement in her symptoms by the day before discharge.  On  the day before discharge, the patient had a gastrograft enema, which did  not reveal any retained stool and it was essentially a normal study.  The patient was discharged, at this time, with instructions to follow up  with her primary care physician within in 1 week and gastroenterology  within 3-4 weeks.   ASSESSMENT/PLAN:  1. Delayed gastrointestinal motility.  Patient was instructed to avoid      constipating medications.  The trazodone was substituted for Elavil      for sleep.  She was also begun on Amitiza.  2.  Constipation.  This resolved.  The patient was instructed to      continue MiraLax only if needed for constipation.  3. Gastritis and esophagitis.  This was felt to be due to vomiting.      The patient was instructed to avoid nonsteroidal antiinflammatory      drugs.      Andres Shad. Rudean Curt, MD  Electronically Signed     PML/MEDQ  D:  12/03/2006  T:  12/03/2006  Job:  [id number]   cc:   Holley Bouche, M.D.  James L. Malon Kindle., M.D.

## 2012-01-21 ENCOUNTER — Other Ambulatory Visit: Payer: Self-pay

## 2012-01-21 ENCOUNTER — Emergency Department (HOSPITAL_BASED_OUTPATIENT_CLINIC_OR_DEPARTMENT_OTHER)
Admission: EM | Admit: 2012-01-21 | Discharge: 2012-01-21 | Disposition: A | Discharge status: Home / Self Care | Payer: Managed Care, Other (non HMO) | Attending: Emergency Medicine | Admitting: Emergency Medicine

## 2012-01-21 ENCOUNTER — Encounter (HOSPITAL_BASED_OUTPATIENT_CLINIC_OR_DEPARTMENT_OTHER): Payer: Self-pay | Admitting: Emergency Medicine

## 2012-01-21 ENCOUNTER — Emergency Department (HOSPITAL_BASED_OUTPATIENT_CLINIC_OR_DEPARTMENT_OTHER): Payer: Managed Care, Other (non HMO)

## 2012-01-21 DIAGNOSIS — R0789 Other chest pain: Secondary | ICD-10-CM | POA: Insufficient documentation

## 2012-01-21 DIAGNOSIS — R079 Chest pain, unspecified: Secondary | ICD-10-CM | POA: Insufficient documentation

## 2012-01-21 DIAGNOSIS — R110 Nausea: Secondary | ICD-10-CM | POA: Insufficient documentation

## 2012-01-21 DIAGNOSIS — R0602 Shortness of breath: Secondary | ICD-10-CM | POA: Insufficient documentation

## 2012-01-21 DIAGNOSIS — M549 Dorsalgia, unspecified: Secondary | ICD-10-CM | POA: Insufficient documentation

## 2012-01-21 HISTORY — DX: Fibromyalgia: M79.7

## 2012-01-21 HISTORY — DX: Rheumatoid arthritis, unspecified: M06.9

## 2012-01-21 HISTORY — DX: Restless legs syndrome: G25.81

## 2012-01-21 HISTORY — DX: Pure hypercholesterolemia, unspecified: E78.00

## 2012-01-21 LAB — BASIC METABOLIC PANEL
BUN: 15 mg/dL (ref 6–23)
CO2: 24 mEq/L (ref 19–32)
Calcium: 9.3 mg/dL (ref 8.4–10.5)
GFR calc non Af Amer: 61 mL/min — ABNORMAL LOW (ref >90)
Glucose, Bld: 102 mg/dL — ABNORMAL HIGH (ref 70–99)

## 2012-01-21 LAB — DIFFERENTIAL
Basophils Relative: 0 % (ref 0–1)
Eosinophils Absolute: 0.2 10*3/uL (ref 0.0–0.7)
Eosinophils Relative: 3 % (ref 0–5)
Lymphs Abs: 2.8 10*3/uL (ref 0.7–4.0)
Monocytes Relative: 6 % (ref 3–12)
Neutrophils Relative %: 48 % (ref 43–77)

## 2012-01-21 LAB — CBC
Hemoglobin: 13.4 g/dL (ref 12.0–15.0)
MCH: 30.1 pg (ref 26.0–34.0)
MCHC: 35.6 g/dL (ref 30.0–36.0)
MCV: 84.5 fL (ref 78.0–100.0)
Platelets: 191 10*3/uL (ref 150–400)

## 2012-01-21 NOTE — ED Notes (Signed)
Left sided chest pain, radiating to left shoulder, up neck and around to back on left side.  Some SOB, some nausea.  Diaphoresis several times.  Positive family history and elevated cholesterol.

## 2012-01-21 NOTE — Discharge Instructions (Signed)
Chest Pain (Nonspecific) Dr. Johnathan Hausen office will contact you tomorrow morning to arrange for close followup. If you don't hear from them by tomorrow at lunchtime call to schedule an appointment. Tell office staff that Dr.Nyzier Boivin with Dr. Tiburcio Pea regarding your case. Return if your condition worsens for any It is often hard to give a specific diagnosis for the cause of chest pain. There is always a chance that your pain could be related to something serious, such as a heart attack or a blood clot in the lungs. You need to follow up with your caregiver for further evaluation. CAUSES   Heartburn.   Pneumonia or bronchitis.   Anxiety or stress.   Inflammation around your heart (pericarditis) or lung (pleuritis or pleurisy).   A blood clot in the lung.   A collapsed lung (pneumothorax). It can develop suddenly on its own (spontaneous pneumothorax) or from injury (trauma) to the chest.   Shingles infection (herpes zoster virus).  The chest wall is composed of bones, muscles, and cartilage. Any of these can be the source of the pain.  The bones can be bruised by injury.   The muscles or cartilage can be strained by coughing or overwork.   The cartilage can be affected by inflammation and become sore (costochondritis).  DIAGNOSIS  Lab tests or other studies, such as X-rays, electrocardiography, stress testing, or cardiac imaging, may be needed to find the cause of your pain.  TREATMENT   Treatment depends on what may be causing your chest pain. Treatment may include:   Acid blockers for heartburn.   Anti-inflammatory medicine.   Pain medicine for inflammatory conditions.   Antibiotics if an infection is present.   You may be advised to change lifestyle habits. This includes stopping smoking and avoiding alcohol, caffeine, and chocolate.   You may be advised to keep your head raised (elevated) when sleeping. This reduces the chance of acid going backward from your stomach into your  esophagus.   Most of the time, nonspecific chest pain will improve within 2 to 3 days with rest and mild pain medicine.  HOME CARE INSTRUCTIONS   If antibiotics were prescribed, take your antibiotics as directed. Finish them even if you start to feel better.   For the next few days, avoid physical activities that bring on chest pain. Continue physical activities as directed.   Do not smoke.   Avoid drinking alcohol.   Only take over-the-counter or prescription medicine for pain, discomfort, or fever as directed by your caregiver.   Follow your caregiver's suggestions for further testing if your chest pain does not go away.   Keep any follow-up appointments you made. If you do not go to an appointment, you could develop lasting (chronic) problems with pain. If there is any problem keeping an appointment, you must call to reschedule.  SEEK MEDICAL CARE IF:   You think you are having problems from the medicine you are taking. Read your medicine instructions carefully.   Your chest pain does not go away, even after treatment.   You develop a rash with blisters on your chest.  SEEK IMMEDIATE MEDICAL CARE IF:   You have increased chest pain or pain that spreads to your arm, neck, jaw, back, or abdomen.   You develop shortness of breath, an increasing cough, or you are coughing up blood.   You have severe back or abdominal pain, feel nauseous, or vomit.   You develop severe weakness, fainting, or chills.   You have a  fever.  THIS IS AN EMERGENCY. Do not wait to see if the pain will go away. Get medical help at once. Call your local emergency services (911 in [place] Do not drive yourself to the hospital. MAKE SURE YOU:   Understand these instructions.   Will watch your condition.   Will get help right away if you are not doing well or get worse.  Document Released: 05/29/2005 Document Revised: 08/08/2011 Document Reviewed: 03/24/2008 [facility] Patient Information 2012 [place],  [place].

## 2012-01-21 NOTE — ED Provider Notes (Addendum)
History     CSN: [id number]  Arrival date & time 01/21/12  1712   First MD Initiated Contact with Patient 01/21/12 1732      Chief Complaint  Patient presents with  . Chest Pain  . Back Pain    (Consider location/radiation/quality/duration/timing/severity/associated sxs/prior treatment) HPI Complains of anterior chest pain radiating to left scapula area , dull in quality and left neck onset upon awakening 8:30 AM today worse with raising her left arm improved by turning her arm still pain is mild constant she noticed shortness of breath while walking up steps today and had a brief episode of epigastric pain and nausea and diaphoresis lasting approximately 30 seconds. She treated herself with  aspirin. No other associated symptoms Past Medical History  Diagnosis Date  . Hypercholesteremia   . Fibromyalgia   . Rheumatoid arthritis   . Migraine   . Restless leg syndrome     Past Surgical History  Procedure Date  . Colon surgery   . Foot surgery   . Breast surgery     breast implants and removal    No family history on file. Mother had quadruple bypass surgery in her 30s father died in his 73s History  Substance Use Topics  . Smoking status: Not on file  . Smokeless tobacco: Not on file  . Alcohol Use:     OB History    Grav Para Term Preterm Abortions TAB SAB Ect Mult Living                  Review of Systems  Constitutional: Negative.   HENT: Negative.   Respiratory: Positive for shortness of breath.   Cardiovascular: Positive for chest pain.       Sweatiness  Gastrointestinal: Positive for nausea.  Musculoskeletal: Negative.   Skin: Negative.   Neurological: Negative.   Hematological: Negative.   Psychiatric/Behavioral: Negative.     Allergies  Codeine  Home Medications  No current outpatient prescriptions on file.  BP 149/85  Pulse 81  Resp 16  Ht 5\' 4"  (1.626 m)  Wt 151 lb (68.493 kg)  BMI 25.92 kg/m2  SpO2 98%  Physical Exam  Nursing  note and vitals reviewed. Constitutional: She appears well-developed and well-nourished.  HENT:  Head: Normocephalic and atraumatic.  Eyes: Conjunctivae are normal. Pupils are equal, round, and reactive to light.  Neck: Neck supple. No tracheal deviation present. No thyromegaly present.  Cardiovascular: Normal rate and regular rhythm.   No murmur heard. Pulmonary/Chest: Effort normal and breath sounds normal.  Abdominal: Soft. Bowel sounds are normal. She exhibits no distension. There is no tenderness.  Musculoskeletal: Normal range of motion. She exhibits no edema and no tenderness.       Chest pain is reproduced by forcible abduction of left shoulder all 4 extremities atraumatic, neurovascularly intact  Neurological: She is alert. Coordination normal.  Skin: Skin is warm and dry. No rash noted.  Psychiatric: She has a normal mood and affect.    ED Course  Procedures (including critical care time)   Labs Reviewed  BASIC METABOLIC PANEL  CBC  DIFFERENTIAL  TROPONIN I   No results found.   No diagnosis found.   Date: 01/21/2012  Rate: 85  Rhythm: normal sinus rhythm  QRS Axis: normal  Intervals: normal  ST/T Wave abnormalities: normal  Conduction Disutrbances: none  Narrative Interpretation: unremarkable  no prior EKG for comparison Results for orders placed during the hospital encounter of 01/21/12  BASIC METABOLIC PANEL  Component Value Range   Sodium 140  135 - 145 (mEq/L)   Potassium 3.8  3.5 - 5.1 (mEq/L)   Chloride 103  96 - 112 (mEq/L)   CO2 24  19 - 32 (mEq/L)   Glucose, Bld 102 (*) 70 - 99 (mg/dL)   BUN 15  6 - 23 (mg/dL)   Creatinine, Ser 4.09  0.50 - 1.10 (mg/dL)   Calcium 9.3  8.4 - 81.1 (mg/dL)   GFR calc non Af Amer 61 (*) >90 (mL/min)   GFR calc Af Amer 71 (*) >90 (mL/min)  CBC      Component Value Range   WBC 6.5  4.0 - 10.5 (K/uL)   RBC 4.45  3.87 - 5.11 (MIL/uL)   Hemoglobin 13.4  12.0 - 15.0 (g/dL)   HCT 91.4  78.2 - 95.6 (%)   MCV  84.5  78.0 - 100.0 (fL)   MCH 30.1  26.0 - 34.0 (pg)   MCHC 35.6  30.0 - 36.0 (g/dL)   RDW 21.3  08.6 - 57.8 (%)   Platelets 191  150 - 400 (K/uL)  DIFFERENTIAL      Component Value Range   Neutrophils Relative 48  43 - 77 (%)   Neutro Abs 3.2  1.7 - 7.7 (K/uL)   Lymphocytes Relative 43  12 - 46 (%)   Lymphs Abs 2.8  0.7 - 4.0 (K/uL)   Monocytes Relative 6  3 - 12 (%)   Monocytes Absolute 0.4  0.1 - 1.0 (K/uL)   Eosinophils Relative 3  0 - 5 (%)   Eosinophils Absolute 0.2  0.0 - 0.7 (K/uL)   Basophils Relative 0  0 - 1 (%)   Basophils Absolute 0.0  0.0 - 0.1 (K/uL)  TROPONIN I      Component Value Range   Troponin I <0.30  <0.30 (ng/mL)   Dg Chest 2 View  01/21/2012  *RADIOLOGY REPORT*  Clinical Data: Chest pain, back pain.  CHEST - 2 VIEW  Comparison: 03/11/2007.  Findings: Heart and mediastinal contours are within normal limits. No focal opacities or effusions.  No acute bony abnormality.  IMPRESSION: No active cardiopulmonary disease.  Original Report Authenticated By: Cyndie Chime, M.D.  Xray reviewed by me  6:55 PM patient resting comfortably MDM  Doubt acute coronary syndrome given atypical symptoms normal EKG and negative cardiac markers after several hours of symptoms. Pain is easily reproducible and nonexertional. I feel that patient can have outpatient cardiac workup Case discussed with Dr. Tiburcio Pea who will contact patient tomorrow to arrange close followup Diagnoses atypical chest pain        Doug Sou, MD 01/21/12 [id number]  Doug Sou, MD 01/21/12 2952

## 2012-01-21 NOTE — ED Notes (Signed)
Page to Dr. Holley Bouche to return call to Dr. Coralee Pesa at [phone]

## 2012-01-21 NOTE — ED Notes (Signed)
MD at bedside. 

## 2012-02-03 DIAGNOSIS — E785 Hyperlipidemia, unspecified: Secondary | ICD-10-CM | POA: Diagnosis not present

## 2012-02-03 DIAGNOSIS — R079 Chest pain, unspecified: Secondary | ICD-10-CM | POA: Diagnosis not present

## 2012-02-03 DIAGNOSIS — R5383 Other fatigue: Secondary | ICD-10-CM | POA: Diagnosis not present

## 2012-02-03 DIAGNOSIS — R5381 Other malaise: Secondary | ICD-10-CM | POA: Diagnosis not present

## 2012-02-03 DIAGNOSIS — IMO0001 Reserved for inherently not codable concepts without codable children: Secondary | ICD-10-CM | POA: Diagnosis not present

## 2012-02-06 DIAGNOSIS — E785 Hyperlipidemia, unspecified: Secondary | ICD-10-CM | POA: Diagnosis not present

## 2012-02-06 DIAGNOSIS — R079 Chest pain, unspecified: Secondary | ICD-10-CM | POA: Diagnosis not present

## 2012-02-06 DIAGNOSIS — Z8249 Family history of ischemic heart disease and other diseases of the circulatory system: Secondary | ICD-10-CM | POA: Diagnosis not present

## 2012-02-12 DIAGNOSIS — E785 Hyperlipidemia, unspecified: Secondary | ICD-10-CM | POA: Diagnosis not present

## 2012-02-12 DIAGNOSIS — Z8249 Family history of ischemic heart disease and other diseases of the circulatory system: Secondary | ICD-10-CM | POA: Diagnosis not present

## 2012-02-12 DIAGNOSIS — R079 Chest pain, unspecified: Secondary | ICD-10-CM | POA: Diagnosis not present

## 2012-02-19 DIAGNOSIS — R079 Chest pain, unspecified: Secondary | ICD-10-CM | POA: Diagnosis not present

## 2012-02-20 ENCOUNTER — Other Ambulatory Visit: Payer: Self-pay | Admitting: Cardiology

## 2012-02-20 NOTE — H&P (Signed)
Patient: Wendy [name] Provider: Donato Schultz, MD  DOB: [birth year] Age: 61 Y Sex: Female Date: 02/19/2012  Phone: [phone]   Address: [address] [place], [place], [id number]  Pcp: Wendy [name], Wendy [name]        Subjective:     CC:    1. MS/stress test f/u.        HPI:  General:  61 year old no DM, no HTN, +HL, non smoker (quit 29 years ago), Father CABG 90's, Mother died MI 70's.  2 weeks ago, started having heaviness in chest and back/shoulder/ left arm and neck. Broke out in sweat, nausea. Ginger Ale. Take an ASA 81mg . Lay down on couch. Drove to [facility]. ECG, Blood work OK, CXR done. Went home. Since then, happened 3 more times including this am. Have a cough as well at times, non congested. Husband is in [place] for 4 years. Stressful. 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 Felt her pain when going up stairs. Today discomfort occurred while on phone with husband. Duration of the pain in chest is short lived but the middle of back pain lingers. +SOB with these episodes.  Her nuclear stress test did not demonstrate any perfusion abnormalities. Her blood pressure did decrease at peak stress. She did have left shoulder/scapular pain during stress. She's also had continued ongoing jaw discomfort..        ROS:  Chronic abdominal pain, no syncope, no bleeding, no orthopnea The other elements of the review of systems are negative (12 total elements).       Medical History: IBS, Migraine headaches, Anxiety, Restless leg syndrome, Fibromyalgia, Chronic abdominal pain, history of exploratory laparotomy for adhesions, Rheumatoid arthritis, seronegative.        Family History: Father: alive Mother: deceased CAD at an early age Paternal Grand Father: deceased Paternal Grand Mother: deceased Maternal Grand Father: deceased Maternal Grand Mother: deceased Brother 1: alive  as above.       Social History:  General:  History of smoking  cigarettes:  Never smoked no Smoking, Brief period for a few years 30 years ago..  no Alcohol.  no Recreational drug use.        Medications: Fish Oil 1000 MG Capsule 1 capsule with a meal Once a day, Aspirin 81 MG Tablet Chewable 1 tablet Once a day, Amitriptyline HCl 100 MG Tablet 1 tablet at bedtime Once a day, Klonipin .5 mg tab 1 tablet qhs, Topamax 25 MG Tablet 1 tablet at bedtime Once a day, Wellbutrin XL 150 MG Tablet Extended Release 24 Hour 1 tablet every morning Once a day, Cyclobenzaprine Comfort Pac 10 MG Kit as directed , Clonazepam 1 MG Tablet 1 tablet at bedtime, Medication List reviewed and reconciled with the patient       Allergies: Codeine Sulfate: GI Upset, Lyrica: sedation, Mirapex: anxiety, Trazodone HCl: rash.       Objective:     Vitals: Wt 150, Wt change -2 lb, Ht 64.5, BMI 25.35, Pulse sitting 110, BP sitting 120/90.       Examination:  Cardiology, General:  GENERAL APPEARANCE: pleasant, NAD.  HEENT: unremarkable.  CAROTID UPSTROKE: normal, no bruit.  JVD: flat.  HEART SOUNDS: regular, normal S1, S2, no S3 or S4.  MURMUR: absent.  LUNGS: no rales or wheezes.  ABDOMEN: soft, non tender, positive bowel sounds, no masses felt.  EXTREMITIES: no leg edema.  PERIPHERAL PULSES: 2 plus bilateral.  Stress test reviewed. Lab work reviewed.  Assessment:     Assessment:  1. Chest pain - 786.50 (Primary)    Plan:     1. Chest pain  LAB: Basic Metabolic Glu 64 (slightly low)    GLUCOSE 64  70-99 - mg/dL L   BUN 12  1-30 - mg/dL    CREATININE 8.65  7.84-6.96 - mg/dl    eGFR (NON-AFRICAN AMERICAN) 57 >60 - calc L   eGFR (AFRICAN AMERICAN) 69 >60 - calc    SODIUM 139  136-145 - mmol/L    POTASSIUM 3.7  3.5-5.5 - mmol/L    CHLORIDE 104  98-107 - mmol/L    C02 30  22-32 - mg/dL    ANION GAP 9.1 2.9-52.8 - mmol/L    CALCIUM 9.4  8.6-10.3 - mg/dL     [facility] 41/32/4401 08:50:40 PM > ok for cath    LAB: CBC with Diff    WBC 5.0 4.0-11.0 - K/ul    RBC 4.50  4.20-5.40 - M/uL    HGB 13.3 12.0-16.0 - g/dL    HCT 02.7 25.3-66.4 - %    MCH 29.6 27.0-33.0 - pg    MPV 7.5 7.5-10.7 - fL    MCV 88.7 81.0-99.0 - fL    MCHC 33.3 32.0-36.0 - g/dL    RDW 40.3 47.4-25.9 - %    NRBC# 0.00 -    PLT 175 150-400 - K/uL    NEUT % 57.0 43.3-71.9 - %    NRBC% 0.00 - %    LYMPH% 34.8 16.8-43.5 - %    MONO % 5.2 4.6-12.4 - %    EOS % 1.8 0.0-7.8 - %    BASO % 1.2 0.0-1.0 - % H   NEUT # 2.9 1.9-7.2 - K/uL    LYMPH# 1.70 1.10-2.70 - K/uL    MONO # 0.3 0.3-0.8 - K/uL    EOS # 0.1 0.0-0.6 - K/uL    BASO # 0.1 0.0-0.1 - K/uL     Lynnette Pote 02/19/2012 08:50:40 PM > ok for cath    LAB: PT (Prothrombin Time) ([id number])    Prothrombin Time 10.2 9.1-12.0 - SEC    INR 1.0 0.8-1.2 -     Valin Massie 02/19/2012 08:50:40 PM > ok for cath    We will proceed with cardiac catheterization. Ongoing symptoms. Risks and benefits of cardiac catheterization have been reviewed including risk of stroke, heart attack, death, bleeding, renal impariment and arterial damage. There was ample oppurtuny to answer questions. Alternatives were discussed. Patient understands and wishes to proceed. JV lab.        Immunizations:        Labs:        Procedure Codes: 64332 ECL BMP, 85025 ECL CBC PLATELET DIFF, 95188 BLOOD COLLECTION ROUTINE VENIPUNCTURE       Preventive:         Follow Up: post cath      Provider: Donato Schultz, MD  Patient: Wendy [name], Wendy [name] DOB: 08-17-1951 Date: 02/19/2012

## 2012-02-21 ENCOUNTER — Encounter (HOSPITAL_BASED_OUTPATIENT_CLINIC_OR_DEPARTMENT_OTHER)
Admission: RE | Disposition: A | Discharge status: Home / Self Care | Payer: Self-pay | Source: Ambulatory Visit | Attending: Cardiology

## 2012-02-21 ENCOUNTER — Inpatient Hospital Stay (HOSPITAL_BASED_OUTPATIENT_CLINIC_OR_DEPARTMENT_OTHER)
Admission: RE | Admit: 2012-02-21 | Discharge: 2012-02-21 | Disposition: A | Discharge status: Home / Self Care | Payer: Managed Care, Other (non HMO) | Source: Ambulatory Visit | Attending: Cardiology | Admitting: Cardiology

## 2012-02-21 DIAGNOSIS — Z8249 Family history of ischemic heart disease and other diseases of the circulatory system: Secondary | ICD-10-CM | POA: Insufficient documentation

## 2012-02-21 DIAGNOSIS — R079 Chest pain, unspecified: Secondary | ICD-10-CM | POA: Diagnosis not present

## 2012-02-21 SURGERY — JV LEFT HEART CATHETERIZATION WITH CORONARY ANGIOGRAM
Anesthesia: Moderate Sedation

## 2012-02-21 MED ORDER — ONDANSETRON HCL 4 MG/2ML IJ SOLN: 4.0000 mg | Freq: Four times a day (QID) | INTRAMUSCULAR | Status: DC | PRN

## 2012-02-21 MED ORDER — DIAZEPAM 5 MG PO TABS
5.0000 mg | ORAL_TABLET | ORAL | Status: AC
  Administered 2012-02-21: 5 mg via ORAL

## 2012-02-21 MED ORDER — SODIUM CHLORIDE 0.9 % IV SOLN: INTRAVENOUS | Status: DC

## 2012-02-21 MED ORDER — SODIUM CHLORIDE 0.9 % IJ SOLN: 3.0000 mL | INTRAMUSCULAR | Status: DC | PRN

## 2012-02-21 MED ORDER — ASPIRIN 81 MG PO CHEW
324.0000 mg | CHEWABLE_TABLET | ORAL | Status: AC
  Administered 2012-02-21: 324 mg via ORAL

## 2012-02-21 MED ORDER — SODIUM CHLORIDE 0.9 % IJ SOLN: 3.0000 mL | Freq: Two times a day (BID) | INTRAMUSCULAR | Status: DC

## 2012-02-21 MED ORDER — ACETAMINOPHEN 325 MG PO TABS: 650.0000 mg | ORAL_TABLET | ORAL | Status: DC | PRN

## 2012-02-21 MED ORDER — SODIUM CHLORIDE 0.9 % IV SOLN: 250.0000 mL | INTRAVENOUS | Status: DC | PRN

## 2012-02-21 NOTE — OR Nursing (Signed)
Dr Skains at bedside to discuss results and treatment plan with pt and family 

## 2012-02-21 NOTE — H&P (View-Only) (Signed)
  Patient: Wendy [name] Provider: Myasia Sinatra, MD  DOB: 01/25/1951 Age: 60 Y Sex: Female Date: [name]  Phone: [phone]   Address: [address], [place], [place]  Pcp: Wendy [name]        Subjective:     CC:    1. MS/stress test f/u.        HPI:  General:  60 year old no DM, no HTN, +HL, non smoker (quit 29 years ago), Father CABG 60's, Mother died MI 80's.  2 weeks ago, started having heaviness in chest and back/shoulder/ left arm and neck. Broke out in sweat, nausea. Ginger Ale. Take an ASA 81mg. Lay down on couch. Drove to [facility] [place]. ECG, Blood work OK, CXR done. Went home. Since then, happened 3 more times including this am. Have a cough as well at times, non congested. Husband is in [place] for 4 years. Stressful. 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 Felt her pain when going up stairs. Today discomfort occurred while on phone with husband. Duration of the pain in chest is short lived but the middle of back pain lingers. +SOB with these episodes.  Her nuclear stress test did not demonstrate any perfusion abnormalities. Her blood pressure did decrease at peak stress. She did have left shoulder/scapular pain during stress. She's also had continued ongoing jaw discomfort..        ROS:  Chronic abdominal pain, no syncope, no bleeding, no orthopnea The other elements of the review of systems are negative (12 total elements).       Medical History: IBS, Migraine headaches, Anxiety, Restless leg syndrome, Fibromyalgia, Chronic abdominal pain, history of exploratory laparotomy for adhesions, Rheumatoid arthritis, seronegative.        Family History: Father: alive Mother: deceased CAD at an early age Paternal Grand Father: deceased Paternal Grand Mother: deceased Maternal Grand Father: deceased Maternal Grand Mother: deceased Brother 1: alive  as above.       Social History:  General:  History of smoking  cigarettes:  Never smoked no Smoking, Brief period for a few years 30 years ago..  no Alcohol.  no Recreational drug use.        Medications: Fish Oil 1000 MG Capsule 1 capsule with a meal Once a day, Aspirin 81 MG Tablet Chewable 1 tablet Once a day, Amitriptyline HCl 100 MG Tablet 1 tablet at bedtime Once a day, Klonipin .5 mg tab 1 tablet qhs, Topamax 25 MG Tablet 1 tablet at bedtime Once a day, Wellbutrin XL 150 MG Tablet Extended Release 24 Hour 1 tablet every morning Once a day, Cyclobenzaprine Comfort Pac 10 MG Kit as directed , Clonazepam 1 MG Tablet 1 tablet at bedtime, Medication List reviewed and reconciled with the patient       Allergies: Codeine Sulfate: GI Upset, Lyrica: sedation, Mirapex: anxiety, Trazodone HCl: rash.       Objective:     Vitals: Wt 150, Wt change -2 lb, Ht 64.5, BMI 25.35, Pulse sitting 110, BP sitting 120/90.       Examination:  Cardiology, General:  GENERAL APPEARANCE: pleasant, NAD.  HEENT: unremarkable.  CAROTID UPSTROKE: normal, no bruit.  JVD: flat.  HEART SOUNDS: regular, normal S1, S2, no S3 or S4.  MURMUR: absent.  LUNGS: no rales or wheezes.  ABDOMEN: soft, non tender, positive bowel sounds, no masses felt.  EXTREMITIES: no leg edema.  PERIPHERAL PULSES: 2 plus bilateral.  Stress test reviewed. [name] work reviewed.         Assessment:     Assessment:  1. Chest pain - 786.50 (Primary)    Plan:     1. Chest pain  [name]: Basic Metabolic Glu 64 (slightly low)    GLUCOSE 64  70-99 - mg/dL L   BUN 12  6-26 - mg/dL    CREATININE 0.99  0.60-1.30 - mg/dl    eGFR (NON-AFRICAN AMERICAN) 57 >60 - calc L   eGFR (AFRICAN AMERICAN) 69 >60 - calc    SODIUM 139  136-145 - mmol/L    POTASSIUM 3.7  3.5-5.5 - mmol/L    CHLORIDE 104  98-107 - mmol/L    C02 30  22-32 - mg/dL    ANION GAP 9.1 6.0-20.0 - mmol/L    CALCIUM 9.4  8.6-10.3 - mg/dL     Wendy [name]    [name]: CBC with Diff    WBC 5.0 4.0-11.0 - K/ul    RBC 4.50  4.20-5.40 - M/uL    HGB 13.3 12.0-16.0 - g/dL    HCT 40.0 37.0-47.0 - %    MCH 29.6 27.0-33.0 - pg    MPV 7.5 7.5-10.7 - fL    MCV 88.7 81.0-99.0 - fL    MCHC 33.3 32.0-36.0 - g/dL    RDW 13.2 11.5-15.5 - %    NRBC# 0.00 -    PLT 175 150-400 - K/uL    NEUT % 57.0 43.3-71.9 - %    NRBC% 0.00 - %    LYMPH% 34.8 16.8-43.5 - %    MONO % 5.2 4.6-12.4 - %    EOS % 1.8 0.0-7.8 - %    BASO % 1.2 0.0-1.0 - % H   NEUT # 2.9 1.9-7.2 - K/uL    LYMPH# 1.70 1.10-2.70 - K/uL    MONO # 0.3 0.3-0.8 - K/uL    EOS # 0.1 0.0-0.6 - K/uL    BASO # 0.1 0.0-0.1 - K/uL     Wendy [name] [name] [name] [name] > ok for [name]    [name]: PT (Prothrombin Time) (005199)    Prothrombin Time 10.2 9.1-12.0 - SEC    INR 1.0 0.8-1.2 -     Wendy [name] [name] [name] [name] > ok for [name]    We will proceed with cardiac catheterization. Ongoing symptoms. Risks and benefits of cardiac catheterization have been reviewed including risk of stroke, heart attack, death, bleeding, renal impariment and arterial damage. There was ample oppurtuny to answer questions. Alternatives were discussed. Patient understands and wishes to proceed. Wendy [name].        Immunizations:        Labs:        Procedure Codes: 80048 ECL BMP, 85025 ECL CBC PLATELET DIFF, 36415 BLOOD COLLECTION ROUTINE VENIPUNCTURE       Preventive:         Follow Up: post [name]      Provider: Janille Draughon, MD  Patient: Wendy [name] DOB: 11/23/1950 Date: [name]    

## 2012-02-21 NOTE — CV Procedure (Signed)
PROCEDURE:  Left heart catheterization with selective coronary angiography, left ventriculogram.  INDICATIONS:  61 year old with hyperlipidemia, father bypass surgery in her 50s mother died of heart attack in her 66s has been experiencing chest heaviness back shoulder left arm neck sweats nausea. Her husband is working in [place]. She underwent nuclear stress test and other than decrease in blood pressure during peak stress this test was unremarkable with no evidence of perfusion defect. She continued to have ongoing jaw pain and because of this we proceeded with cardiac catheterization.  The risks, benefits, and details of the procedure were explained to the patient.  The patient verbalized understanding and wanted to proceed.  Informed written consent was obtained.  PROCEDURE TECHNIQUE:  After Xylocaine anesthesia and visualization of the femoral head via fluoroscopy, a 30F sheath was placed in the right femoral artery with a single anterior needle wall stick.   Left coronary angiography was done using a Judkins L4 catheter.  Right coronary angiography was done using a Judkins R4 catheter.  Left ventriculography was done using a pigtail catheter.    CONTRAST:  Total of 75 ml.  FLOUROSCOPY TIME: 2.6 minutes.   COMPLICATIONS:  None.    HEMODYNAMICS:  Aortic pressure was 159/86/196mmHg; LV systolic pressure was ; LVEDP .  There was no gradient between the left ventricle and aorta.    ANGIOGRAPHIC DATA:    Left main: Branches into LAD and circumflex, no angiographically significant disease  Left anterior descending (LAD): No angiographically significant disease, high diagonal branch, 2 small mid-diagonal branches. The LAD wraps around the apex.  Circumflex artery (CIRC): No angiographically significant disease. 3 obtuse marginal branches  Right coronary artery (RCA): Dominant vessel giving rise to PDA. Small caliber. No angiographically significant disease.  LEFT  VENTRICULOGRAM:  Left ventricular angiogram was done in the 30 RAO projection and revealed normal left ventricular wall motion and systolic function with an estimated ejection fraction of 50-55 %.   IMPRESSIONS:  1. No angiographically significant coronary artery disease.  2. Normal left ventricular systolic function.  LVEDP 16 mmHg.  Ejection fraction 50-55 %.  RECOMMENDATION:  Reassuring study. Noncardiac pain. Will have followup in one week for post cath.

## 2012-02-21 NOTE — OR Nursing (Signed)
Tegaderm dressing applied, site level 0, bedrest begins at 1110 

## 2012-02-21 NOTE — Interval H&P Note (Signed)
History and Physical Interval Note:  02/21/2012 10:30 AM  Wendy [name]  has presented today for surgery, with the diagnosis of chest pain  The various methods of treatment have been discussed with the patient and family. After consideration of risks, benefits and other options for treatment, the patient has consented to  Procedure(s) (LRB): JV LEFT HEART CATHETERIZATION WITH CORONARY ANGIOGRAM (N/A) as a surgical intervention .  The patient's history has been reviewed, patient examined, no change in status, stable for surgery.  I have reviewed the patients' chart and labs.  Questions were answered to the patient's satisfaction.     Osborne Serio

## 2012-02-28 DIAGNOSIS — R079 Chest pain, unspecified: Secondary | ICD-10-CM | POA: Diagnosis not present

## 2012-02-28 DIAGNOSIS — Z48812 Encounter for surgical aftercare following surgery on the circulatory system: Secondary | ICD-10-CM | POA: Diagnosis not present

## 2013-01-13 DIAGNOSIS — R300 Dysuria: Secondary | ICD-10-CM | POA: Diagnosis not present

## 2013-01-13 DIAGNOSIS — G2589 Other specified extrapyramidal and movement disorders: Secondary | ICD-10-CM | POA: Diagnosis not present

## 2013-01-13 DIAGNOSIS — M720 Palmar fascial fibromatosis [Dupuytren]: Secondary | ICD-10-CM | POA: Diagnosis not present

## 2013-01-13 DIAGNOSIS — IMO0001 Reserved for inherently not codable concepts without codable children: Secondary | ICD-10-CM | POA: Diagnosis not present

## 2013-01-13 DIAGNOSIS — E785 Hyperlipidemia, unspecified: Secondary | ICD-10-CM | POA: Diagnosis not present

## 2013-01-13 DIAGNOSIS — G43909 Migraine, unspecified, not intractable, without status migrainosus: Secondary | ICD-10-CM | POA: Diagnosis not present

## 2013-07-20 DIAGNOSIS — Z1231 Encounter for screening mammogram for malignant neoplasm of breast: Secondary | ICD-10-CM | POA: Diagnosis not present

## 2013-12-28 DIAGNOSIS — E785 Hyperlipidemia, unspecified: Secondary | ICD-10-CM | POA: Diagnosis not present

## 2013-12-28 DIAGNOSIS — R5383 Other fatigue: Secondary | ICD-10-CM | POA: Diagnosis not present

## 2013-12-28 DIAGNOSIS — H9190 Unspecified hearing loss, unspecified ear: Secondary | ICD-10-CM | POA: Diagnosis not present

## 2013-12-28 DIAGNOSIS — G43909 Migraine, unspecified, not intractable, without status migrainosus: Secondary | ICD-10-CM | POA: Diagnosis not present

## 2013-12-28 DIAGNOSIS — M720 Palmar fascial fibromatosis [Dupuytren]: Secondary | ICD-10-CM | POA: Diagnosis not present

## 2013-12-28 DIAGNOSIS — R5381 Other malaise: Secondary | ICD-10-CM | POA: Diagnosis not present

## 2013-12-28 DIAGNOSIS — IMO0001 Reserved for inherently not codable concepts without codable children: Secondary | ICD-10-CM | POA: Diagnosis not present

## 2013-12-28 DIAGNOSIS — G2589 Other specified extrapyramidal and movement disorders: Secondary | ICD-10-CM | POA: Diagnosis not present

## 2014-01-11 DIAGNOSIS — J040 Acute laryngitis: Secondary | ICD-10-CM | POA: Diagnosis not present

## 2014-01-11 DIAGNOSIS — H9319 Tinnitus, unspecified ear: Secondary | ICD-10-CM | POA: Diagnosis not present

## 2014-01-11 DIAGNOSIS — H903 Sensorineural hearing loss, bilateral: Secondary | ICD-10-CM | POA: Diagnosis not present

## 2014-01-18 DIAGNOSIS — J342 Deviated nasal septum: Secondary | ICD-10-CM | POA: Diagnosis not present

## 2014-06-02 DIAGNOSIS — E784 Other hyperlipidemia: Secondary | ICD-10-CM | POA: Diagnosis not present

## 2014-06-26 ENCOUNTER — Encounter: Payer: Self-pay | Admitting: *Deleted

## 2014-12-27 DIAGNOSIS — E785 Hyperlipidemia, unspecified: Secondary | ICD-10-CM | POA: Diagnosis not present

## 2015-02-03 DIAGNOSIS — M797 Fibromyalgia: Secondary | ICD-10-CM | POA: Diagnosis not present

## 2015-02-03 DIAGNOSIS — G259 Extrapyramidal and movement disorder, unspecified: Secondary | ICD-10-CM | POA: Diagnosis not present

## 2015-02-03 DIAGNOSIS — G43909 Migraine, unspecified, not intractable, without status migrainosus: Secondary | ICD-10-CM | POA: Diagnosis not present

## 2015-02-03 DIAGNOSIS — E785 Hyperlipidemia, unspecified: Secondary | ICD-10-CM | POA: Diagnosis not present

## 2015-07-10 DIAGNOSIS — M797 Fibromyalgia: Secondary | ICD-10-CM | POA: Diagnosis not present

## 2015-07-10 DIAGNOSIS — J0100 Acute maxillary sinusitis, unspecified: Secondary | ICD-10-CM | POA: Diagnosis not present

## 2015-07-10 DIAGNOSIS — H6983 Other specified disorders of Eustachian tube, bilateral: Secondary | ICD-10-CM | POA: Diagnosis not present

## 2015-07-10 DIAGNOSIS — E2839 Other primary ovarian failure: Secondary | ICD-10-CM | POA: Diagnosis not present

## 2015-08-31 DIAGNOSIS — J019 Acute sinusitis, unspecified: Secondary | ICD-10-CM | POA: Diagnosis not present

## 2015-12-25 DIAGNOSIS — E785 Hyperlipidemia, unspecified: Secondary | ICD-10-CM | POA: Diagnosis not present

## 2015-12-25 DIAGNOSIS — R300 Dysuria: Secondary | ICD-10-CM | POA: Diagnosis not present

## 2015-12-25 DIAGNOSIS — M170 Bilateral primary osteoarthritis of knee: Secondary | ICD-10-CM | POA: Diagnosis not present

## 2015-12-25 DIAGNOSIS — N898 Other specified noninflammatory disorders of vagina: Secondary | ICD-10-CM | POA: Diagnosis not present

## 2016-01-03 DIAGNOSIS — M1711 Unilateral primary osteoarthritis, right knee: Secondary | ICD-10-CM | POA: Diagnosis not present

## 2016-01-03 DIAGNOSIS — M1712 Unilateral primary osteoarthritis, left knee: Secondary | ICD-10-CM | POA: Diagnosis not present

## 2016-04-05 DIAGNOSIS — E785 Hyperlipidemia, unspecified: Secondary | ICD-10-CM | POA: Diagnosis not present

## 2016-04-17 DIAGNOSIS — H2513 Age-related nuclear cataract, bilateral: Secondary | ICD-10-CM | POA: Diagnosis not present

## 2016-04-17 DIAGNOSIS — H524 Presbyopia: Secondary | ICD-10-CM | POA: Diagnosis not present

## 2016-04-17 DIAGNOSIS — H5203 Hypermetropia, bilateral: Secondary | ICD-10-CM | POA: Diagnosis not present

## 2016-05-09 DIAGNOSIS — M1711 Unilateral primary osteoarthritis, right knee: Secondary | ICD-10-CM | POA: Diagnosis not present

## 2016-05-09 DIAGNOSIS — M1712 Unilateral primary osteoarthritis, left knee: Secondary | ICD-10-CM | POA: Diagnosis not present

## 2016-06-02 DIAGNOSIS — M545 Low back pain: Secondary | ICD-10-CM | POA: Diagnosis not present

## 2016-06-02 DIAGNOSIS — R319 Hematuria, unspecified: Secondary | ICD-10-CM | POA: Diagnosis not present

## 2016-06-02 DIAGNOSIS — R1031 Right lower quadrant pain: Secondary | ICD-10-CM | POA: Diagnosis not present

## 2016-06-02 DIAGNOSIS — R109 Unspecified abdominal pain: Secondary | ICD-10-CM | POA: Diagnosis not present

## 2016-06-02 DIAGNOSIS — M5441 Lumbago with sciatica, right side: Secondary | ICD-10-CM | POA: Diagnosis not present

## 2016-07-09 DIAGNOSIS — G43909 Migraine, unspecified, not intractable, without status migrainosus: Secondary | ICD-10-CM | POA: Diagnosis not present

## 2016-07-09 DIAGNOSIS — M797 Fibromyalgia: Secondary | ICD-10-CM | POA: Diagnosis not present

## 2016-07-09 DIAGNOSIS — Z23 Encounter for immunization: Secondary | ICD-10-CM | POA: Diagnosis not present

## 2016-07-09 DIAGNOSIS — G259 Extrapyramidal and movement disorder, unspecified: Secondary | ICD-10-CM | POA: Diagnosis not present

## 2016-07-09 DIAGNOSIS — E7800 Pure hypercholesterolemia, unspecified: Secondary | ICD-10-CM | POA: Diagnosis not present

## 2016-11-08 DIAGNOSIS — M1712 Unilateral primary osteoarthritis, left knee: Secondary | ICD-10-CM | POA: Diagnosis not present

## 2016-11-08 DIAGNOSIS — M170 Bilateral primary osteoarthritis of knee: Secondary | ICD-10-CM | POA: Diagnosis not present

## 2016-11-08 DIAGNOSIS — M1711 Unilateral primary osteoarthritis, right knee: Secondary | ICD-10-CM | POA: Diagnosis not present

## 2016-12-11 DIAGNOSIS — B351 Tinea unguium: Secondary | ICD-10-CM | POA: Diagnosis not present

## 2016-12-11 DIAGNOSIS — S161XXA Strain of muscle, fascia and tendon at neck level, initial encounter: Secondary | ICD-10-CM | POA: Diagnosis not present

## 2017-04-16 DIAGNOSIS — G2581 Restless legs syndrome: Secondary | ICD-10-CM | POA: Diagnosis not present

## 2017-04-16 DIAGNOSIS — Z1211 Encounter for screening for malignant neoplasm of colon: Secondary | ICD-10-CM | POA: Diagnosis not present

## 2017-04-16 DIAGNOSIS — E7800 Pure hypercholesterolemia, unspecified: Secondary | ICD-10-CM | POA: Diagnosis not present

## 2017-04-16 DIAGNOSIS — G43909 Migraine, unspecified, not intractable, without status migrainosus: Secondary | ICD-10-CM | POA: Diagnosis not present

## 2017-04-16 DIAGNOSIS — Z1231 Encounter for screening mammogram for malignant neoplasm of breast: Secondary | ICD-10-CM | POA: Diagnosis not present

## 2017-04-16 DIAGNOSIS — M797 Fibromyalgia: Secondary | ICD-10-CM | POA: Diagnosis not present

## 2017-05-14 DIAGNOSIS — G5753 Tarsal tunnel syndrome, bilateral lower limbs: Secondary | ICD-10-CM | POA: Diagnosis not present

## 2017-05-14 DIAGNOSIS — E2839 Other primary ovarian failure: Secondary | ICD-10-CM | POA: Diagnosis not present

## 2017-05-14 DIAGNOSIS — F419 Anxiety disorder, unspecified: Secondary | ICD-10-CM | POA: Diagnosis not present

## 2017-08-08 ENCOUNTER — Other Ambulatory Visit: Payer: Self-pay | Admitting: Family Medicine

## 2017-08-08 ENCOUNTER — Ambulatory Visit
Admission: RE | Admit: 2017-08-08 | Discharge: 2017-08-08 | Disposition: A | Discharge status: Home / Self Care | Payer: Medicare Other | Source: Ambulatory Visit | Attending: Family Medicine | Admitting: Family Medicine

## 2017-08-08 DIAGNOSIS — R1031 Right lower quadrant pain: Secondary | ICD-10-CM | POA: Diagnosis not present

## 2017-08-08 DIAGNOSIS — R109 Unspecified abdominal pain: Secondary | ICD-10-CM | POA: Diagnosis not present

## 2017-08-08 DIAGNOSIS — M5441 Lumbago with sciatica, right side: Secondary | ICD-10-CM | POA: Diagnosis not present

## 2017-08-08 DIAGNOSIS — R110 Nausea: Secondary | ICD-10-CM | POA: Diagnosis not present

## 2017-08-08 MED ORDER — IOPAMIDOL (ISOVUE-300) INJECTION 61%
100.0000 mL | Freq: Once | INTRAVENOUS | Status: AC | PRN
  Administered 2017-08-08: 100 mL via INTRAVENOUS

## 2017-12-31 DIAGNOSIS — G43909 Migraine, unspecified, not intractable, without status migrainosus: Secondary | ICD-10-CM | POA: Diagnosis not present

## 2017-12-31 DIAGNOSIS — M797 Fibromyalgia: Secondary | ICD-10-CM | POA: Diagnosis not present

## 2017-12-31 DIAGNOSIS — E7800 Pure hypercholesterolemia, unspecified: Secondary | ICD-10-CM | POA: Diagnosis not present

## 2017-12-31 DIAGNOSIS — B351 Tinea unguium: Secondary | ICD-10-CM | POA: Diagnosis not present

## 2017-12-31 DIAGNOSIS — G2581 Restless legs syndrome: Secondary | ICD-10-CM | POA: Diagnosis not present

## 2018-01-08 DIAGNOSIS — M797 Fibromyalgia: Secondary | ICD-10-CM | POA: Diagnosis not present

## 2018-01-08 DIAGNOSIS — M79604 Pain in right leg: Secondary | ICD-10-CM | POA: Diagnosis not present

## 2018-01-08 DIAGNOSIS — M79661 Pain in right lower leg: Secondary | ICD-10-CM | POA: Diagnosis not present

## 2018-08-31 DIAGNOSIS — M169 Osteoarthritis of hip, unspecified: Secondary | ICD-10-CM | POA: Insufficient documentation

## 2018-10-21 ENCOUNTER — Other Ambulatory Visit: Payer: Self-pay | Admitting: Orthopedic Surgery

## 2018-10-21 DIAGNOSIS — M545 Low back pain, unspecified: Secondary | ICD-10-CM

## 2018-10-21 DIAGNOSIS — G8929 Other chronic pain: Secondary | ICD-10-CM

## 2018-10-22 ENCOUNTER — Telehealth: Payer: Self-pay | Admitting: Nurse Practitioner

## 2018-10-22 NOTE — Telephone Encounter (Signed)
Phone call to patient to verify medication list and allergies for myelogram procedure. Pt instructed to hold Elavil and Zoloft for 48hrs prior to myelogram appointment time. Pt verbalized understanding.

## 2018-10-30 ENCOUNTER — Ambulatory Visit
Admission: RE | Admit: 2018-10-30 | Discharge: 2018-10-30 | Disposition: A | Discharge status: Home / Self Care | Payer: Self-pay | Source: Ambulatory Visit | Attending: Orthopedic Surgery | Admitting: Orthopedic Surgery

## 2018-10-30 ENCOUNTER — Ambulatory Visit
Admission: RE | Admit: 2018-10-30 | Discharge: 2018-10-30 | Disposition: A | Discharge status: Home / Self Care | Payer: Medicare Other | Source: Ambulatory Visit | Attending: Orthopedic Surgery | Admitting: Orthopedic Surgery

## 2018-10-30 ENCOUNTER — Other Ambulatory Visit: Payer: Self-pay | Admitting: Orthopedic Surgery

## 2018-10-30 DIAGNOSIS — R52 Pain, unspecified: Secondary | ICD-10-CM

## 2018-10-30 DIAGNOSIS — M545 Low back pain, unspecified: Secondary | ICD-10-CM

## 2018-10-30 DIAGNOSIS — G8929 Other chronic pain: Secondary | ICD-10-CM

## 2018-10-30 MED ORDER — DIAZEPAM 5 MG PO TABS
5.0000 mg | ORAL_TABLET | Freq: Once | ORAL | Status: AC
  Administered 2018-10-30: 5 mg via ORAL

## 2018-10-30 MED ORDER — IOPAMIDOL (ISOVUE-M 200) INJECTION 41%
18.0000 mL | Freq: Once | INTRAMUSCULAR | Status: AC
  Administered 2018-10-30: 18 mL via INTRATHECAL

## 2018-10-30 NOTE — Progress Notes (Signed)
Pt reports she has been off of her Elevail and Zoloft for 48 hours.

## 2018-10-30 NOTE — Discharge Instructions (Signed)
Myelogram Discharge Instructions  1. Go home and rest quietly for the next 24 hours.  It is important to lie flat for the next 24 hours.  Get up only to go to the restroom.  You may lie in the bed or on a couch on your back, your stomach, your left side or your right side.  You may have one pillow under your head.  You may have pillows between your knees while you are on your side or under your knees while you are on your back.  2. DO NOT drive today.  Recline the seat as far back as it will go, while still wearing your seat belt, on the way home.  3. You may get up to go to the bathroom as needed.  You may sit up for 10 minutes to eat.  You may resume your normal diet and medications unless otherwise indicated.  Drink lots of extra fluids today and tomorrow.  4. The incidence of headache, nausea, or vomiting is about 5% (one in 20 patients).  If you develop a headache, lie flat and drink plenty of fluids until the headache goes away.  Caffeinated beverages may be helpful.  If you develop severe nausea and vomiting or a headache that does not go away with flat bed rest, call [phone].  5. You may resume normal activities after your 24 hours of bed rest is over; however, do not exert yourself strongly or do any heavy lifting tomorrow. If when you get up you have a headache when standing, go back to bed and force fluids for another 24 hours.  6. Call your physician for a follow-up appointment.  The results of your myelogram will be sent directly to your physician by the following day.  7. If you have any questions or if complications develop after you arrive home, please call [phone].  Discharge instructions have been explained to the patient.  The patient, or the person responsible for the patient, fully understands these instructions.  YOU MAY RESTART YOUR ELAVIL AND ZOLOFT TOMORROW 10/31/2018 AT 10:30AM.

## 2019-04-26 ENCOUNTER — Ambulatory Visit (INDEPENDENT_AMBULATORY_CARE_PROVIDER_SITE_OTHER): Payer: Medicare Other

## 2019-04-26 ENCOUNTER — Other Ambulatory Visit: Payer: Self-pay

## 2019-04-26 ENCOUNTER — Other Ambulatory Visit: Payer: Self-pay | Admitting: Podiatry

## 2019-04-26 ENCOUNTER — Ambulatory Visit (INDEPENDENT_AMBULATORY_CARE_PROVIDER_SITE_OTHER): Payer: Medicare Other | Admitting: Podiatry

## 2019-04-26 VITALS — Temp 98.1°F

## 2019-04-26 DIAGNOSIS — M79671 Pain in right foot: Secondary | ICD-10-CM | POA: Insufficient documentation

## 2019-04-26 DIAGNOSIS — M79672 Pain in left foot: Secondary | ICD-10-CM

## 2019-04-26 DIAGNOSIS — S92354D Nondisplaced fracture of fifth metatarsal bone, right foot, subsequent encounter for fracture with routine healing: Secondary | ICD-10-CM | POA: Insufficient documentation

## 2019-04-26 DIAGNOSIS — D3610 Benign neoplasm of peripheral nerves and autonomic nervous system, unspecified: Secondary | ICD-10-CM | POA: Diagnosis not present

## 2019-04-26 DIAGNOSIS — S92354A Nondisplaced fracture of fifth metatarsal bone, right foot, initial encounter for closed fracture: Secondary | ICD-10-CM | POA: Insufficient documentation

## 2019-04-26 MED ORDER — DICLOFENAC SODIUM 1 % TD GEL: 2.0000 g | Freq: Four times a day (QID) | TRANSDERMAL | 2 refills | Status: DC

## 2019-05-05 NOTE — Progress Notes (Signed)
Subjective:   Patient ID: Wendy [name], female   DOB: 68 y.o.   MRN: [id number]   HPI 68 year old female presents the office today for concerns of pain to bilateral second and third digits.  Is been ongoing for the last 6 months.  She says it feels funny over a year but is getting worse.  She states worse at nighttime she is having numbness of the second and third toes.  Secondary concerns of pain to the back of the heel on the right side is minimal.  She has discomfort walking down steps.  No recent injury but she does have a history of a "broken foot".  No recent injury.  She has no other concerns today.  She does feel a lot of her symptoms are coming from her statin.  She states that she stopped statin last week and since then her symptoms have much improved.  Review of Systems  All other systems reviewed and are negative.  Past Medical History:  Diagnosis Date  . Fibromyalgia   . Hypercholesteremia   . Migraine   . Restless leg syndrome   . Rheumatoid arthritis(714.0)     Past Surgical History:  Procedure Laterality Date  . BREAST SURGERY     breast implants and removal  . COLON SURGERY    . FOOT SURGERY       Current Outpatient Medications:  .  amitriptyline (ELAVIL) 100 MG tablet, Take 100 mg by mouth at bedtime., Disp: , Rfl:  .  amoxicillin (AMOXIL) 875 MG tablet, TAKE 1 TABLET BY MOUTH EVERY 12 HOURS FOR 10 DAYS, Disp: , Rfl:  .  aspirin 81 MG tablet, Take 81 mg by mouth daily., Disp: , Rfl:  .  Aspirin-Calcium Carbonate 81-777 MG TABS, Take by mouth., Disp: , Rfl:  .  atorvastatin (LIPITOR) 20 MG tablet, Take 20 mg by mouth daily., Disp: , Rfl:  .  calcium carbonate (OS-CAL - DOSED IN MG OF ELEMENTAL CALCIUM) 1250 (500 Ca) MG tablet, Take 1 tablet by mouth., Disp: , Rfl:  .  Ferrous Sulfate (IRON) 90 (18 Fe) MG TABS, , Disp: , Rfl:  .  ferrous sulfate 325 (65 FE) MG tablet, daily., Disp: , Rfl:  .  gabapentin (NEURONTIN) 300 MG capsule, Take 300 mg by mouth 3 (three)  times daily., Disp: , Rfl:  .  ibuprofen (ADVIL) 600 MG tablet, ibuprofen 600 mg tablet  TAKE 1 TABLET BY MOUTH EVERY 6 HOURS AS NEEDED FOR PAIN, Disp: , Rfl:  .  Ibuprofen-Famotidine (DUEXIS) 800-26.6 MG TABS, Duexis 800 mg-26.6 mg tablet  Take 1 tablet twice a day by oral route., Disp: , Rfl:  .  Omega-3 Fatty Acids (FISH OIL PO), Take 1 capsule by mouth daily., Disp: , Rfl:  .  ondansetron (ZOFRAN) 4 MG tablet, Take 4 mg by mouth every 8 (eight) hours as needed for nausea or vomiting., Disp: , Rfl:  .  Ondansetron (ZUPLENZ) 4 MG FILM, Take by mouth., Disp: , Rfl:  .  sertraline (ZOLOFT) 50 MG tablet, Take 50 mg by mouth daily., Disp: , Rfl:  .  terbinafine (LAMISIL) 250 MG tablet, terbinafine HCl 250 mg tablet  TAKE 1 TABLET BY MOUTH ONCE DAILY FOR 90 DAYS, Disp: , Rfl:  .  tiZANidine (ZANAFLEX) 2 MG tablet, Take by mouth every 6 (six) hours as needed for muscle spasms., Disp: , Rfl:  .  topiramate (TOPAMAX) 50 MG tablet, Take 50 mg by mouth 2 (two) times daily., Disp: , Rfl:  .  traMADol (ULTRAM) 50 MG tablet, tramadol 50 mg tablet  TAKE 1 TABLET BY MOUTH EVERY 6 HOURS AS NEEDED FOR PAIN, Disp: , Rfl:  .  clonazePAM (KLONOPIN) 1 MG tablet, Take 1 mg by mouth 2 (two) times daily as needed., Disp: , Rfl:  .  diclofenac sodium (VOLTAREN) 1 % GEL, Apply 2 g topically 4 (four) times daily. Rub into affected area of foot 2 to 4 times daily, Disp: 100 g, Rfl: 2 .  famotidine (PEPCID) 20 MG tablet, famotidine 20 mg tablet  Take 1 tablet twice a day by oral route., Disp: , Rfl:  .  predniSONE (DELTASONE) 10 MG tablet, TAKE 4 TABLETS BY MOUTH ONCE DAILY FOR 2 DAYS THEN TAKE 3 TABLETS ONCE DAILY FOR 2 DAYS THEN TAKE 2 TABLETS ONCE DAILY FOR 2 DAYS THEN TAKE, Disp: , Rfl:   Allergies  Allergen Reactions  . Guaifenesin Anaphylaxis  . Other Anaphylaxis    anestesthia  . Pseudoephedrine Anaphylaxis  . Codeine Nausea And Vomiting  . Lyrica [Pregabalin] Nausea And Vomiting  . Mirapex [Pramipexole  Dihydrochloride] Nausea And Vomiting  . Trazodone Nausea And Vomiting  . Guaifenesin Er           Objective:  Physical Exam  General: AAO x3, NAD  Dermatological: Skin is warm, dry and supple bilateral. Nails x 10 are well manicured; remaining integument appears unremarkable at this time. There are no open sores, no preulcerative lesions, no rash or signs of infection present.  Vascular: Dorsalis Pedis artery and Posterior Tibial artery pedal pulses are 2/4 bilateral with immedate capillary fill time.There is no pain with calf compression, swelling, warmth, erythema.   Neruologic: Grossly intact via light touch bilateral. Vibratory intact via tuning fork bilateral. Protective threshold with Semmes Wienstein monofilament intact to all pedal sites bilateral.  Negative Tinel sign.  Musculoskeletal: There is currently no palpable neuroma identified second interspace but she does get some mild discomfort in the interspace.  Subjectively she can numbness the second third toe with discomfort.  At this time no significant discomfort the posterior aspect heel but she does get some discomfort walking down steps.  No pain backslash the calcaneus.  No other areas of tenderness.  No edema, erythema bilaterally.  Muscular strength 5/5 in all groups tested bilateral.  Gait: Unassisted, Nonantalgic.      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  Evidence of acute fracture or stress fracture.  Very minimal spurring posterior aspect of her heel. -Discussion regards treatment options.  She declined steroid injection.  She does feel her symptoms are much improved after stopping the statin and she believes this is because of her symptoms.  Dispensed neuroma pads.  Discussed modifications.  As she is in  for the last couple days we will continue with treatment. Ice to the area if needed.   Trula Slade DPM

## 2019-06-07 ENCOUNTER — Ambulatory Visit: Payer: Medicare Other | Admitting: Podiatry

## 2019-06-11 ENCOUNTER — Encounter (HOSPITAL_BASED_OUTPATIENT_CLINIC_OR_DEPARTMENT_OTHER): Payer: Self-pay

## 2019-06-11 ENCOUNTER — Emergency Department (HOSPITAL_BASED_OUTPATIENT_CLINIC_OR_DEPARTMENT_OTHER)
Admission: EM | Admit: 2019-06-11 | Discharge: 2019-06-11 | Disposition: A | Discharge status: Home / Self Care | Payer: Medicare Other | Attending: Emergency Medicine | Admitting: Emergency Medicine

## 2019-06-11 ENCOUNTER — Emergency Department (HOSPITAL_BASED_OUTPATIENT_CLINIC_OR_DEPARTMENT_OTHER): Payer: Medicare Other

## 2019-06-11 ENCOUNTER — Other Ambulatory Visit: Payer: Self-pay

## 2019-06-11 DIAGNOSIS — G44229 Chronic tension-type headache, not intractable: Secondary | ICD-10-CM | POA: Insufficient documentation

## 2019-06-11 DIAGNOSIS — M069 Rheumatoid arthritis, unspecified: Secondary | ICD-10-CM | POA: Insufficient documentation

## 2019-06-11 DIAGNOSIS — Z7982 Long term (current) use of aspirin: Secondary | ICD-10-CM | POA: Insufficient documentation

## 2019-06-11 DIAGNOSIS — Z888 Allergy status to other drugs, medicaments and biological substances status: Secondary | ICD-10-CM | POA: Diagnosis not present

## 2019-06-11 DIAGNOSIS — Z20828 Contact with and (suspected) exposure to other viral communicable diseases: Secondary | ICD-10-CM | POA: Insufficient documentation

## 2019-06-11 DIAGNOSIS — E782 Mixed hyperlipidemia: Secondary | ICD-10-CM | POA: Insufficient documentation

## 2019-06-11 DIAGNOSIS — R519 Headache, unspecified: Secondary | ICD-10-CM

## 2019-06-11 DIAGNOSIS — Z79899 Other long term (current) drug therapy: Secondary | ICD-10-CM | POA: Insufficient documentation

## 2019-06-11 DIAGNOSIS — Z87891 Personal history of nicotine dependence: Secondary | ICD-10-CM | POA: Diagnosis not present

## 2019-06-11 DIAGNOSIS — M797 Fibromyalgia: Secondary | ICD-10-CM | POA: Insufficient documentation

## 2019-06-11 DIAGNOSIS — Z886 Allergy status to analgesic agent status: Secondary | ICD-10-CM | POA: Insufficient documentation

## 2019-06-11 LAB — CBC WITH DIFFERENTIAL/PLATELET
Abs Immature Granulocytes: 0.05 10*3/uL (ref 0.00–0.07)
Basophils Absolute: 0 10*3/uL (ref 0.0–0.1)
Basophils Relative: 0 %
Eosinophils Absolute: 0.1 10*3/uL (ref 0.0–0.5)
Eosinophils Relative: 2 %
HCT: 43.9 % (ref 36.0–46.0)
Hemoglobin: 14.5 g/dL (ref 12.0–15.0)
Immature Granulocytes: 1 %
Lymphocytes Relative: 24 %
Lymphs Abs: 1.6 10*3/uL (ref 0.7–4.0)
MCH: 30.3 pg (ref 26.0–34.0)
MCHC: 33 g/dL (ref 30.0–36.0)
MCV: 91.6 fL (ref 80.0–100.0)
Monocytes Absolute: 0.5 10*3/uL (ref 0.1–1.0)
Monocytes Relative: 7 %
Neutro Abs: 4.5 10*3/uL (ref 1.7–7.7)
Neutrophils Relative %: 66 %
Platelets: 220 10*3/uL (ref 150–400)
RBC: 4.79 MIL/uL (ref 3.87–5.11)
RDW: 12 % (ref 11.5–15.5)
WBC: 6.8 10*3/uL (ref 4.0–10.5)
nRBC: 0 % (ref 0.0–0.2)

## 2019-06-11 LAB — SEDIMENTATION RATE: Sed Rate: 5 mm/hr (ref 0–22)

## 2019-06-11 LAB — BASIC METABOLIC PANEL
Anion gap: 12 (ref 5–15)
BUN: 10 mg/dL (ref 8–23)
CO2: 24 mmol/L (ref 22–32)
Calcium: 9.5 mg/dL (ref 8.9–10.3)
Chloride: 96 mmol/L — ABNORMAL LOW (ref 98–111)
Creatinine, Ser: 0.89 mg/dL (ref 0.44–1.00)
GFR calc Af Amer: >60 mL/min (ref >60)
GFR calc non Af Amer: >60 mL/min (ref >60)
Glucose, Bld: 106 mg/dL — ABNORMAL HIGH (ref 70–99)
Potassium: 3.6 mmol/L (ref 3.5–5.1)
Sodium: 132 mmol/L — ABNORMAL LOW (ref 135–145)

## 2019-06-11 LAB — CBG MONITORING, ED: Glucose-Capillary: 93 mg/dL (ref 70–99)

## 2019-06-11 LAB — SARS CORONAVIRUS 2 (TAT 6-24 HRS): SARS Coronavirus 2: NEGATIVE

## 2019-06-11 LAB — C-REACTIVE PROTEIN: CRP: <0.8 mg/dL

## 2019-06-11 LAB — MAGNESIUM: Magnesium: 2.1 mg/dL (ref 1.7–2.4)

## 2019-06-11 MED ORDER — KETOROLAC TROMETHAMINE 15 MG/ML IJ SOLN
15.0000 mg | Freq: Once | INTRAMUSCULAR | Status: AC
  Administered 2019-06-11: 15 mg via INTRAVENOUS
  Filled 2019-06-11: qty 1

## 2019-06-11 MED ORDER — SODIUM CHLORIDE 0.9 % IV BOLUS
1000.0000 mL | Freq: Once | INTRAVENOUS | Status: AC
  Administered 2019-06-11: 1000 mL via INTRAVENOUS

## 2019-06-11 MED ORDER — METOCLOPRAMIDE HCL 5 MG/ML IJ SOLN
10.0000 mg | Freq: Once | INTRAMUSCULAR | Status: AC
  Administered 2019-06-11: 10 mg via INTRAVENOUS
  Filled 2019-06-11: qty 2

## 2019-06-11 NOTE — ED Provider Notes (Addendum)
[facility] EMERGENCY DEPARTMENT Provider Note   CSN: [id number] Arrival date & time: 06/11/19  1217     History   Chief Complaint Chief Complaint  Patient presents with  . Headache    HPI Wendy [name] is a 68 y.o. female past medical history of fibromyalgia, migraines, restless leg syndrome presenting to the emergency department with headache and paresthesias.  She reports that she woke up with a headache 7 days ago.  It is frontally located and throbbing.  She wondered if it might be a possible sinus infection.  She states it has been constant for 7 days and not associated with position or movement.  She states she has a history of migraines but this does not feel like a migraine to her.  It is associated with some mild nausea but no vomiting.  There is no photophobia or neck stiffness or fevers.  She denies any vision changes or pain with jaw movement or chewing.  She reports incidentally for the past several days she has felt a sensation of "numbness" in her fingertips and around her mouth.  She states she has felt this way in the past but is unsure what is the cause of this.  She does report that she is on Topamax and amitriptyline for her headaches.  She recently cut her amitriptyline dose in half 2 weeks ago on her own accord because she wants to "get off this medication."  She takes occasional NSAIDs but not daily.  She does report she drinks 2 large cups of coffee every day and believes that the caffeine does help her headaches.  She denies any history of brain imaging in the past. She denies any sick contacts in the house.      HPI  Past Medical History:  Diagnosis Date  . Fibromyalgia   . Hypercholesteremia   . Migraine   . Restless leg syndrome   . Rheumatoid arthritis(714.0)     Patient Active Problem List   Diagnosis Date Noted  . Nondisplaced fracture of fifth metatarsal bone, right foot, initial encounter for closed fracture 04/26/2019  .  Nondisplaced fracture of fifth metatarsal bone, right foot, subsequent encounter for fracture with routine healing 04/26/2019  . Pain in right foot 04/26/2019  . Osteoarthritis of hip 08/31/2018    Past Surgical History:  Procedure Laterality Date  . ABDOMINAL HYSTERECTOMY    . BREAST SURGERY     breast implants and removal  . COLON SURGERY    . FOOT SURGERY       OB History   No obstetric history on file.      Home Medications    Prior to Admission medications   Medication Sig Start Date End Date Taking? Authorizing Provider  amitriptyline (ELAVIL) 100 MG tablet Take 100 mg by mouth at bedtime.   Yes [provider]  aspirin 81 MG tablet Take 81 mg by mouth daily.   Yes [provider]  calcium carbonate (OS-CAL - DOSED IN MG OF ELEMENTAL CALCIUM) 1250 (500 Ca) MG tablet Take 1 tablet by mouth.   Yes [provider]  clonazePAM (KLONOPIN) 1 MG tablet Take 1 mg by mouth 2 (two) times daily as needed.   Yes [provider]  ferrous sulfate 325 (65 FE) MG tablet daily.   Yes [provider]  gabapentin (NEURONTIN) 300 MG capsule Take 300 mg by mouth 3 (three) times daily.   Yes [provider]  Omega-3 Fatty Acids (FISH OIL PO)  Take 1 capsule by mouth daily.   Yes [provider]  ondansetron (ZOFRAN) 4 MG tablet Take 4 mg by mouth every 8 (eight) hours as needed for nausea or vomiting.   Yes [provider]  sertraline (ZOLOFT) 50 MG tablet Take 50 mg by mouth daily.   Yes [provider]  tiZANidine (ZANAFLEX) 2 MG tablet Take by mouth every 6 (six) hours as needed for muscle spasms.   Yes [provider]  topiramate (TOPAMAX) 50 MG tablet Take 50 mg by mouth 2 (two) times daily.   Yes [provider]  diclofenac sodium (VOLTAREN) 1 % GEL Apply 2 g topically 4 (four) times daily. Rub into affected area of foot 2 to 4 times daily 04/26/19   Trula Slade, DPM  famotidine (PEPCID)  20 MG tablet famotidine 20 mg tablet  Take 1 tablet twice a day by oral route.    [provider]    Family History Family History  Problem Relation Age of Onset  . Coronary artery disease Mother     Social History Social History   Tobacco Use  . Smoking status: Former Research scientist (life sciences)  . Smokeless tobacco: Never Used  Substance Use Topics  . Alcohol use: Never    Frequency: Never  . Drug use: Never     Allergies   Guaifenesin, Other, Pseudoephedrine, Codeine, Lyrica [pregabalin], Mirapex [pramipexole dihydrochloride], Trazodone, and Guaifenesin er   Review of Systems Review of Systems  Constitutional: Negative for chills and fever.  HENT: Positive for congestion, sinus pressure and sinus pain. Negative for ear pain.   Eyes: Negative for photophobia, pain, redness and visual disturbance.  Respiratory: Negative for cough and shortness of breath.   Cardiovascular: Negative for chest pain and palpitations.  Gastrointestinal: Negative for abdominal pain, diarrhea, nausea and vomiting.  Genitourinary: Negative for dysuria and hematuria.  Musculoskeletal: Negative for arthralgias and back pain.  Skin: Negative for rash and wound.  Neurological: Positive for numbness and headaches. Negative for syncope and light-headedness.  Psychiatric/Behavioral: Negative for agitation and confusion.  All other systems reviewed and are negative.    Physical Exam Updated Vital Signs BP (!) 142/81 (BP Location: Right Arm)   Pulse 73   Temp 98.4 F (36.9 C) (Oral)   Resp 16   Ht 5' 3"  (1.6 m)   Wt 62.6 kg   SpO2 99%   BMI 24.45 kg/m   Physical Exam Vitals signs and nursing note reviewed.  Constitutional:      General: She is not in acute distress.    Appearance: She is well-developed.  HENT:     Head: Normocephalic and atraumatic.     Comments: +Bilateral maxillary sinus and frontal sinus tenderness Bilateral temporal tenderness Eyes:     General: No scleral icterus.     Extraocular Movements: Extraocular movements intact.     Conjunctiva/sclera: Conjunctivae normal.     Pupils: Pupils are equal, round, and reactive to light. Pupils are equal.  Neck:     Musculoskeletal: Normal range of motion and neck supple. No neck rigidity.  Cardiovascular:     Rate and Rhythm: Normal rate and regular rhythm.     Heart sounds: Normal heart sounds.  Pulmonary:     Effort: Pulmonary effort is normal. No respiratory distress.     Breath sounds: Normal breath sounds.  Abdominal:     Palpations: Abdomen is soft.     Tenderness: There is no abdominal tenderness.  Skin:    General: Skin  is warm and dry.  Neurological:     Mental Status: She is alert.     GCS: GCS eye subscore is 4. GCS verbal subscore is 5. GCS motor subscore is 6.     Cranial Nerves: Cranial nerves are intact.     Sensory: Sensation is intact.     Motor: Motor function is intact. No weakness.     Coordination: Coordination is intact.      ED Treatments / Results  Labs (all labs ordered are listed, but only abnormal results are displayed) Labs Reviewed  BASIC METABOLIC PANEL - Abnormal; Notable for the following components:      Result Value   Sodium 132 (*)    Chloride 96 (*)    Glucose, Bld 106 (*)    All other components within normal limits  SARS CORONAVIRUS 2 (TAT 6-24 HRS)  CBC WITH DIFFERENTIAL/PLATELET  MAGNESIUM  SEDIMENTATION RATE  C-REACTIVE PROTEIN  CBG MONITORING, ED  CBG MONITORING, ED    EKG EKG Interpretation  Date/Time:  Friday June 11 2019 12:24:10 EDT Ventricular Rate:  89 PR Interval:  154 QRS Duration: 88 QT Interval:  368 QTC Calculation: 447 R Axis:   14 Text Interpretation:  Sinus Rhythm No STEMI  Confirmed by Octaviano Glow [phone]) on 06/11/2019 1:12:27 PM   Radiology Ct Head Wo Contrast  Result Date: 06/11/2019 CLINICAL DATA:  Severe frontal headache. EXAM: CT HEAD WITHOUT CONTRAST TECHNIQUE: Contiguous axial images were obtained from the base of  the skull through the vertex without intravenous contrast. COMPARISON:  None. FINDINGS: Brain: No evidence of acute infarction, hemorrhage, hydrocephalus, extra-axial collection or mass lesion/mass effect. Vascular: No hyperdense vessel or unexpected calcification. Skull: Normal. Negative for fracture or focal lesion. Sinuses/Orbits: No acute finding. Other: None. IMPRESSION: No acute intracranial abnormality. Electronically Signed   By: Fidela Salisbury M.D.   On: 06/11/2019 15:26    Procedures Procedures (including critical care time)  Medications Ordered in ED Medications  sodium chloride 0.9 % bolus 1,000 mL (0 mLs Intravenous Stopped 06/11/19 1631)  metoCLOPramide (REGLAN) injection 10 mg (10 mg Intravenous Given 06/11/19 1534)  ketorolac (TORADOL) 15 MG/ML injection 15 mg (15 mg Intravenous Given 06/11/19 1534)     Initial Impression / Assessment and Plan / ED Course  I have reviewed the triage vital signs and the nursing notes.  Pertinent labs & imaging results that were available during my care of the patient were reviewed by me and considered in my medical decision making (see chart for details).  This patient is a 68 year old female with a history of rheumatoid arthritis, fibromyalgia, chronic migraines, presenting to the emergency department today with a headache ongoing for approximately 7 days, as well as paresthesias in her bilateral fingers and near the mouth.  With regards to her headaches, the pattern is most likely tension type.  It is possible she is having some rebound headaches or caffeine use which is quite excessive, or else from her own self-reduction of amitriptyline 2 weeks ago.  It is also possible that she has a sinus infection that she does exhibit some tenderness over the maxilla bilaterally as well as the frontal sinus.  She does however report to me that her headache has been worse in the morning and when laying flat.  Raises concern for an intracranial mass.   She states she has never had any imaging of her brain.  I believe it is reasonable to obtia a CT image while she is here as a screening  exam.  I have a lower suspicion for subarachnoid hemorrhage given that the headache was not abrupt onset and severe. I have a very low suspicion for meningitis.  She does demonstrate some temporal tenderness, although this is nonspecific over the temporal artery, and she has also diffusely tender around her facial muscles as noted above.  She has history of fibromyalgia and rheumatoid arthritis which raise her patella risk for temporal neuritis.  However she has no proximal motor weakness, no changes in vision, and no worsening of pain of claudication.  My suspicion overall is moderately low - will check ESR and CRP as proxy markers, but I do not believe she needs empiric steroids at this time.  Otherwise, with rates to her paresthesias, believe it is reasonable to check her electrolytes, including her potassium and calcium levels.  I do not suspect CNS cause or stroke given this presentation.  We will also check her for COVID-19 given the constellation of her symptoms.  Finally, we will give her a headache cocktail and IV fluids for her symptoms.  I have explained all this fully to the patient and her husband at the bedside and both are agreeable with this plan.  This note was dictated using dragon dictation software.  Please be aware that there may be minor translation errors as a result of this oral dictation   Clinical Course as of Jun 10 1722  Fri Jun 11, 2019  1607 FALISA LAMORA was evaluated in Emergency Department on 06/11/2019 for the symptoms described in the history of present illness. She was evaluated in the context of the global COVID-19 pandemic, which necessitated consideration that the patient might be at risk for infection with the SARS-CoV-2 virus that causes COVID-19. Institutional protocols and algorithms that pertain to the evaluation of  patients at risk for COVID-19 are in a state of rapid change based on information released by regulatory bodies including the CDC and federal and state organizations. These policies and algorithms were followed during the patient's care in the ED.     [MT]  4098 She is feeling better after migraine medicines.  I reviewed her CT imaging and her blood test with her.  We will discharge her home now.   [MT]    Clinical Course User Index [MT] Jetty Berland, Carola Rhine, MD    Final Clinical Impressions(s) / ED Diagnoses   Final diagnoses:  Nonintractable headache, unspecified chronicity pattern, unspecified headache type    ED Discharge Orders    None       Bela Nyborg, Carola Rhine, MD 06/11/19 1732    Wyvonnia Dusky, MD 06/11/19 1733

## 2019-06-11 NOTE — ED Triage Notes (Signed)
Pt c/o HA x 1 week-c/o feeling dizzy, numbness to body x today-pt states her BP was elevated "168/79" today-NAD-steady gait

## 2020-03-13 ENCOUNTER — Telehealth: Payer: Self-pay

## 2020-03-13 NOTE — Telephone Encounter (Signed)
Patient called because Dr. Cherylann Banas did surgery for her many years ago when she was very sick. She said at that time she had many adhesions and he did surgery for them.  She is sick again since Jan and wonders if that might be the problem again. She saw GI, had colonoscopy and he has referred her to general surgeon whom she has appt with in Aug.  She is asking about making an appointment with GYN here and wants to know if they would be opening to listening to her. I advised her to schedule a visit and come and talk with physician and may want to examine her as well.  Transferred to appt desk.

## 2020-03-23 ENCOUNTER — Other Ambulatory Visit: Payer: Self-pay | Admitting: Physician Assistant

## 2020-03-23 ENCOUNTER — Other Ambulatory Visit (HOSPITAL_COMMUNITY)
Admission: RE | Admit: 2020-03-23 | Discharge: 2020-03-23 | Disposition: A | Discharge status: Home / Self Care | Payer: Medicare Other | Source: Ambulatory Visit | Attending: Physician Assistant | Admitting: Physician Assistant

## 2020-03-23 DIAGNOSIS — Z1151 Encounter for screening for human papillomavirus (HPV): Secondary | ICD-10-CM | POA: Diagnosis not present

## 2020-03-23 DIAGNOSIS — Z124 Encounter for screening for malignant neoplasm of cervix: Secondary | ICD-10-CM | POA: Diagnosis present

## 2020-03-24 LAB — CYTOLOGY - PAP
Comment: NEGATIVE
Diagnosis: NEGATIVE
High risk HPV: NEGATIVE

## 2020-04-04 ENCOUNTER — Other Ambulatory Visit: Payer: Self-pay | Admitting: General Surgery

## 2020-04-04 DIAGNOSIS — R1084 Generalized abdominal pain: Secondary | ICD-10-CM

## 2020-04-12 ENCOUNTER — Ambulatory Visit: Payer: Medicare Other | Admitting: Obstetrics and Gynecology

## 2020-04-14 ENCOUNTER — Ambulatory Visit
Admission: RE | Admit: 2020-04-14 | Discharge: 2020-04-14 | Disposition: A | Discharge status: Home / Self Care | Payer: Medicare Other | Source: Ambulatory Visit | Attending: General Surgery | Admitting: General Surgery

## 2020-04-14 DIAGNOSIS — R1084 Generalized abdominal pain: Secondary | ICD-10-CM

## 2020-05-15 ENCOUNTER — Other Ambulatory Visit: Payer: Self-pay

## 2020-05-15 ENCOUNTER — Ambulatory Visit
Admission: RE | Admit: 2020-05-15 | Discharge: 2020-05-15 | Disposition: A | Discharge status: Home / Self Care | Payer: Medicare Other | Source: Ambulatory Visit | Attending: Physician Assistant | Admitting: Physician Assistant

## 2020-05-15 ENCOUNTER — Other Ambulatory Visit: Payer: Self-pay | Admitting: Physician Assistant

## 2020-05-15 DIAGNOSIS — R059 Cough, unspecified: Secondary | ICD-10-CM

## 2020-05-18 ENCOUNTER — Ambulatory Visit: Payer: Medicare Other | Admitting: Physical Therapy

## 2020-05-22 ENCOUNTER — Ambulatory Visit: Payer: Medicare Other | Admitting: Physical Therapy

## 2020-05-29 ENCOUNTER — Encounter: Payer: Medicare Other | Admitting: Physical Therapy

## 2020-06-05 ENCOUNTER — Encounter: Payer: Medicare Other | Admitting: Physical Therapy

## 2020-06-12 ENCOUNTER — Encounter: Payer: Medicare Other | Admitting: Physical Therapy

## 2020-07-14 ENCOUNTER — Other Ambulatory Visit: Payer: Self-pay | Admitting: Gynecology

## 2020-07-14 DIAGNOSIS — R102 Pelvic and perineal pain: Secondary | ICD-10-CM

## 2020-07-14 DIAGNOSIS — Z0389 Encounter for observation for other suspected diseases and conditions ruled out: Secondary | ICD-10-CM

## 2020-07-14 DIAGNOSIS — K6289 Other specified diseases of anus and rectum: Secondary | ICD-10-CM

## 2020-07-14 DIAGNOSIS — K660 Peritoneal adhesions (postprocedural) (postinfection): Secondary | ICD-10-CM

## 2020-08-03 ENCOUNTER — Other Ambulatory Visit: Payer: Self-pay | Admitting: Gynecology

## 2020-08-03 DIAGNOSIS — G8929 Other chronic pain: Secondary | ICD-10-CM

## 2020-08-03 DIAGNOSIS — K660 Peritoneal adhesions (postprocedural) (postinfection): Secondary | ICD-10-CM

## 2020-08-03 DIAGNOSIS — Z0389 Encounter for observation for other suspected diseases and conditions ruled out: Secondary | ICD-10-CM

## 2020-08-03 DIAGNOSIS — K6289 Other specified diseases of anus and rectum: Secondary | ICD-10-CM

## 2020-08-03 DIAGNOSIS — R102 Pelvic and perineal pain: Secondary | ICD-10-CM

## 2020-08-04 ENCOUNTER — Ambulatory Visit
Admission: RE | Admit: 2020-08-04 | Discharge: 2020-08-04 | Disposition: A | Discharge status: Home / Self Care | Payer: Medicare Other | Source: Ambulatory Visit | Attending: Gynecology | Admitting: Gynecology

## 2020-08-04 ENCOUNTER — Other Ambulatory Visit: Payer: Self-pay

## 2020-08-04 DIAGNOSIS — K660 Peritoneal adhesions (postprocedural) (postinfection): Secondary | ICD-10-CM

## 2020-08-04 DIAGNOSIS — Z0389 Encounter for observation for other suspected diseases and conditions ruled out: Secondary | ICD-10-CM

## 2020-08-04 DIAGNOSIS — R102 Pelvic and perineal pain: Secondary | ICD-10-CM

## 2020-08-04 DIAGNOSIS — G8929 Other chronic pain: Secondary | ICD-10-CM

## 2020-08-04 MED ORDER — IOPAMIDOL (ISOVUE-300) INJECTION 61%
100.0000 mL | Freq: Once | INTRAVENOUS | Status: AC | PRN
  Administered 2020-08-04: 100 mL via INTRAVENOUS

## 2020-08-09 ENCOUNTER — Ambulatory Visit
Admission: RE | Admit: 2020-08-09 | Discharge: 2020-08-09 | Disposition: A | Discharge status: Home / Self Care | Payer: Medicare Other | Source: Ambulatory Visit | Attending: Gynecology | Admitting: Gynecology

## 2020-08-09 ENCOUNTER — Other Ambulatory Visit: Payer: Self-pay

## 2020-08-09 DIAGNOSIS — Z0389 Encounter for observation for other suspected diseases and conditions ruled out: Secondary | ICD-10-CM

## 2020-08-09 DIAGNOSIS — G8929 Other chronic pain: Secondary | ICD-10-CM

## 2020-08-09 DIAGNOSIS — K660 Peritoneal adhesions (postprocedural) (postinfection): Secondary | ICD-10-CM

## 2020-08-09 MED ORDER — GADOBENATE DIMEGLUMINE 529 MG/ML IV SOLN
15.0000 mL | Freq: Once | INTRAVENOUS | Status: AC | PRN
  Administered 2020-08-09: 15 mL via INTRAVENOUS

## 2020-09-02 DIAGNOSIS — M797 Fibromyalgia: Secondary | ICD-10-CM | POA: Diagnosis not present

## 2020-09-02 DIAGNOSIS — A419 Sepsis, unspecified organism: Secondary | ICD-10-CM | POA: Diagnosis not present

## 2020-09-02 DIAGNOSIS — D696 Thrombocytopenia, unspecified: Secondary | ICD-10-CM | POA: Diagnosis not present

## 2020-09-02 DIAGNOSIS — G43909 Migraine, unspecified, not intractable, without status migrainosus: Secondary | ICD-10-CM | POA: Diagnosis not present

## 2020-09-02 DIAGNOSIS — E871 Hypo-osmolality and hyponatremia: Secondary | ICD-10-CM | POA: Diagnosis not present

## 2020-09-03 DIAGNOSIS — A419 Sepsis, unspecified organism: Secondary | ICD-10-CM | POA: Diagnosis not present

## 2020-09-03 DIAGNOSIS — N179 Acute kidney failure, unspecified: Secondary | ICD-10-CM | POA: Diagnosis not present

## 2020-09-03 DIAGNOSIS — D696 Thrombocytopenia, unspecified: Secondary | ICD-10-CM | POA: Diagnosis not present

## 2020-09-03 DIAGNOSIS — R6521 Severe sepsis with septic shock: Secondary | ICD-10-CM | POA: Diagnosis not present

## 2020-09-03 DIAGNOSIS — G9341 Metabolic encephalopathy: Secondary | ICD-10-CM | POA: Diagnosis not present

## 2020-09-04 DIAGNOSIS — R6521 Severe sepsis with septic shock: Secondary | ICD-10-CM | POA: Diagnosis not present

## 2020-09-04 DIAGNOSIS — N179 Acute kidney failure, unspecified: Secondary | ICD-10-CM | POA: Diagnosis not present

## 2020-09-04 DIAGNOSIS — G9341 Metabolic encephalopathy: Secondary | ICD-10-CM | POA: Diagnosis not present

## 2020-09-04 DIAGNOSIS — A419 Sepsis, unspecified organism: Secondary | ICD-10-CM | POA: Diagnosis not present

## 2020-09-04 DIAGNOSIS — D696 Thrombocytopenia, unspecified: Secondary | ICD-10-CM | POA: Diagnosis not present

## 2020-09-05 DIAGNOSIS — A419 Sepsis, unspecified organism: Secondary | ICD-10-CM | POA: Diagnosis not present

## 2020-09-05 DIAGNOSIS — R6521 Severe sepsis with septic shock: Secondary | ICD-10-CM | POA: Diagnosis not present

## 2020-09-05 DIAGNOSIS — G9341 Metabolic encephalopathy: Secondary | ICD-10-CM | POA: Diagnosis not present

## 2020-09-05 DIAGNOSIS — N179 Acute kidney failure, unspecified: Secondary | ICD-10-CM | POA: Diagnosis not present

## 2020-09-06 DIAGNOSIS — A419 Sepsis, unspecified organism: Secondary | ICD-10-CM | POA: Diagnosis not present

## 2020-09-06 DIAGNOSIS — N179 Acute kidney failure, unspecified: Secondary | ICD-10-CM | POA: Diagnosis not present

## 2020-09-06 DIAGNOSIS — R6521 Severe sepsis with septic shock: Secondary | ICD-10-CM | POA: Diagnosis not present

## 2020-09-06 DIAGNOSIS — G9341 Metabolic encephalopathy: Secondary | ICD-10-CM | POA: Diagnosis not present

## 2020-09-07 DIAGNOSIS — A419 Sepsis, unspecified organism: Secondary | ICD-10-CM | POA: Diagnosis not present

## 2020-09-07 DIAGNOSIS — G9341 Metabolic encephalopathy: Secondary | ICD-10-CM | POA: Diagnosis not present

## 2020-09-07 DIAGNOSIS — R6521 Severe sepsis with septic shock: Secondary | ICD-10-CM | POA: Diagnosis not present

## 2020-09-07 DIAGNOSIS — N179 Acute kidney failure, unspecified: Secondary | ICD-10-CM | POA: Diagnosis not present

## 2020-09-08 DIAGNOSIS — A419 Sepsis, unspecified organism: Secondary | ICD-10-CM | POA: Diagnosis not present

## 2020-09-14 DIAGNOSIS — U071 COVID-19: Secondary | ICD-10-CM | POA: Diagnosis not present

## 2020-09-14 DIAGNOSIS — R0989 Other specified symptoms and signs involving the circulatory and respiratory systems: Secondary | ICD-10-CM | POA: Diagnosis not present

## 2020-09-14 DIAGNOSIS — R059 Cough, unspecified: Secondary | ICD-10-CM | POA: Diagnosis not present

## 2020-09-14 DIAGNOSIS — Z888 Allergy status to other drugs, medicaments and biological substances status: Secondary | ICD-10-CM | POA: Diagnosis not present

## 2020-09-14 DIAGNOSIS — R6889 Other general symptoms and signs: Secondary | ICD-10-CM | POA: Diagnosis not present

## 2020-09-22 DIAGNOSIS — G2581 Restless legs syndrome: Secondary | ICD-10-CM | POA: Diagnosis not present

## 2020-09-22 DIAGNOSIS — U071 COVID-19: Secondary | ICD-10-CM | POA: Diagnosis not present

## 2020-09-22 DIAGNOSIS — R0981 Nasal congestion: Secondary | ICD-10-CM | POA: Diagnosis not present

## 2020-09-22 DIAGNOSIS — M1990 Unspecified osteoarthritis, unspecified site: Secondary | ICD-10-CM | POA: Diagnosis not present

## 2020-10-09 DIAGNOSIS — G2581 Restless legs syndrome: Secondary | ICD-10-CM | POA: Diagnosis not present

## 2020-10-09 DIAGNOSIS — M1990 Unspecified osteoarthritis, unspecified site: Secondary | ICD-10-CM | POA: Diagnosis not present

## 2020-10-09 DIAGNOSIS — M791 Myalgia, unspecified site: Secondary | ICD-10-CM | POA: Diagnosis not present

## 2020-10-09 DIAGNOSIS — K5900 Constipation, unspecified: Secondary | ICD-10-CM | POA: Diagnosis not present

## 2020-10-16 DIAGNOSIS — G2581 Restless legs syndrome: Secondary | ICD-10-CM | POA: Diagnosis not present

## 2020-10-16 DIAGNOSIS — M791 Myalgia, unspecified site: Secondary | ICD-10-CM | POA: Diagnosis not present

## 2020-10-16 DIAGNOSIS — K5900 Constipation, unspecified: Secondary | ICD-10-CM | POA: Diagnosis not present

## 2020-11-02 DIAGNOSIS — J0100 Acute maxillary sinusitis, unspecified: Secondary | ICD-10-CM | POA: Diagnosis not present

## 2020-11-06 DIAGNOSIS — N183 Chronic kidney disease, stage 3 unspecified: Secondary | ICD-10-CM | POA: Diagnosis not present

## 2020-12-11 DIAGNOSIS — M7741 Metatarsalgia, right foot: Secondary | ICD-10-CM | POA: Diagnosis not present

## 2020-12-11 DIAGNOSIS — M7742 Metatarsalgia, left foot: Secondary | ICD-10-CM | POA: Diagnosis not present

## 2021-08-07 DIAGNOSIS — E7800 Pure hypercholesterolemia, unspecified: Secondary | ICD-10-CM | POA: Diagnosis not present

## 2021-08-07 DIAGNOSIS — M25561 Pain in right knee: Secondary | ICD-10-CM | POA: Diagnosis not present

## 2021-08-07 DIAGNOSIS — Z1231 Encounter for screening mammogram for malignant neoplasm of breast: Secondary | ICD-10-CM | POA: Diagnosis not present

## 2021-08-07 DIAGNOSIS — G8929 Other chronic pain: Secondary | ICD-10-CM | POA: Diagnosis not present

## 2021-08-07 DIAGNOSIS — M797 Fibromyalgia: Secondary | ICD-10-CM | POA: Diagnosis not present

## 2021-08-07 DIAGNOSIS — G43909 Migraine, unspecified, not intractable, without status migrainosus: Secondary | ICD-10-CM | POA: Diagnosis not present

## 2021-08-07 DIAGNOSIS — Z8639 Personal history of other endocrine, nutritional and metabolic disease: Secondary | ICD-10-CM | POA: Diagnosis not present

## 2021-08-07 DIAGNOSIS — G2581 Restless legs syndrome: Secondary | ICD-10-CM | POA: Diagnosis not present

## 2021-08-07 DIAGNOSIS — Z23 Encounter for immunization: Secondary | ICD-10-CM | POA: Diagnosis not present

## 2021-08-07 DIAGNOSIS — N183 Chronic kidney disease, stage 3 unspecified: Secondary | ICD-10-CM | POA: Diagnosis not present

## 2021-08-07 DIAGNOSIS — M25562 Pain in left knee: Secondary | ICD-10-CM | POA: Diagnosis not present

## 2021-12-05 DIAGNOSIS — H25813 Combined forms of age-related cataract, bilateral: Secondary | ICD-10-CM | POA: Diagnosis not present

## 2021-12-05 DIAGNOSIS — Z79899 Other long term (current) drug therapy: Secondary | ICD-10-CM | POA: Diagnosis not present

## 2021-12-06 DIAGNOSIS — H9193 Unspecified hearing loss, bilateral: Secondary | ICD-10-CM | POA: Diagnosis not present

## 2021-12-06 DIAGNOSIS — H9202 Otalgia, left ear: Secondary | ICD-10-CM | POA: Diagnosis not present

## 2021-12-06 DIAGNOSIS — M26622 Arthralgia of left temporomandibular joint: Secondary | ICD-10-CM | POA: Diagnosis not present

## 2022-01-29 DIAGNOSIS — H25812 Combined forms of age-related cataract, left eye: Secondary | ICD-10-CM | POA: Diagnosis not present

## 2022-01-30 DIAGNOSIS — Z48810 Encounter for surgical aftercare following surgery on the sense organs: Secondary | ICD-10-CM | POA: Diagnosis not present

## 2022-01-30 DIAGNOSIS — H02403 Unspecified ptosis of bilateral eyelids: Secondary | ICD-10-CM | POA: Diagnosis not present

## 2022-01-30 DIAGNOSIS — Z961 Presence of intraocular lens: Secondary | ICD-10-CM | POA: Diagnosis not present

## 2022-01-30 DIAGNOSIS — H25812 Combined forms of age-related cataract, left eye: Secondary | ICD-10-CM | POA: Diagnosis not present

## 2022-02-20 DIAGNOSIS — Z961 Presence of intraocular lens: Secondary | ICD-10-CM | POA: Diagnosis not present

## 2022-02-20 DIAGNOSIS — Z48810 Encounter for surgical aftercare following surgery on the sense organs: Secondary | ICD-10-CM | POA: Diagnosis not present

## 2022-02-20 DIAGNOSIS — H02403 Unspecified ptosis of bilateral eyelids: Secondary | ICD-10-CM | POA: Diagnosis not present

## 2022-02-20 DIAGNOSIS — H25811 Combined forms of age-related cataract, right eye: Secondary | ICD-10-CM | POA: Diagnosis not present

## 2022-02-26 DIAGNOSIS — Z87891 Personal history of nicotine dependence: Secondary | ICD-10-CM | POA: Diagnosis not present

## 2022-02-26 DIAGNOSIS — H25811 Combined forms of age-related cataract, right eye: Secondary | ICD-10-CM | POA: Diagnosis not present

## 2022-02-27 DIAGNOSIS — H1820 Unspecified corneal edema: Secondary | ICD-10-CM | POA: Diagnosis not present

## 2022-02-27 DIAGNOSIS — H25812 Combined forms of age-related cataract, left eye: Secondary | ICD-10-CM | POA: Diagnosis not present

## 2022-02-27 DIAGNOSIS — Z48810 Encounter for surgical aftercare following surgery on the sense organs: Secondary | ICD-10-CM | POA: Diagnosis not present

## 2022-02-27 DIAGNOSIS — H02403 Unspecified ptosis of bilateral eyelids: Secondary | ICD-10-CM | POA: Diagnosis not present

## 2022-02-27 DIAGNOSIS — Z885 Allergy status to narcotic agent status: Secondary | ICD-10-CM | POA: Diagnosis not present

## 2022-02-27 DIAGNOSIS — Z961 Presence of intraocular lens: Secondary | ICD-10-CM | POA: Diagnosis not present

## 2022-02-27 DIAGNOSIS — Z884 Allergy status to anesthetic agent status: Secondary | ICD-10-CM | POA: Diagnosis not present

## 2022-02-27 DIAGNOSIS — Z888 Allergy status to other drugs, medicaments and biological substances status: Secondary | ICD-10-CM | POA: Diagnosis not present

## 2022-02-28 DIAGNOSIS — G43909 Migraine, unspecified, not intractable, without status migrainosus: Secondary | ICD-10-CM | POA: Diagnosis not present

## 2022-02-28 DIAGNOSIS — G2581 Restless legs syndrome: Secondary | ICD-10-CM | POA: Diagnosis not present

## 2022-02-28 DIAGNOSIS — M797 Fibromyalgia: Secondary | ICD-10-CM | POA: Diagnosis not present

## 2022-02-28 DIAGNOSIS — E7800 Pure hypercholesterolemia, unspecified: Secondary | ICD-10-CM | POA: Diagnosis not present

## 2022-03-13 DIAGNOSIS — H02403 Unspecified ptosis of bilateral eyelids: Secondary | ICD-10-CM | POA: Diagnosis not present

## 2022-03-13 DIAGNOSIS — Z48810 Encounter for surgical aftercare following surgery on the sense organs: Secondary | ICD-10-CM | POA: Diagnosis not present

## 2022-03-13 DIAGNOSIS — Z961 Presence of intraocular lens: Secondary | ICD-10-CM | POA: Diagnosis not present

## 2022-08-08 ENCOUNTER — Emergency Department (HOSPITAL_BASED_OUTPATIENT_CLINIC_OR_DEPARTMENT_OTHER)
Admission: EM | Admit: 2022-08-08 | Discharge: 2022-08-08 | Disposition: A | Discharge status: Home / Self Care | Payer: Medicare Other | Attending: Emergency Medicine | Admitting: Emergency Medicine

## 2022-08-08 ENCOUNTER — Encounter (HOSPITAL_BASED_OUTPATIENT_CLINIC_OR_DEPARTMENT_OTHER): Payer: Self-pay | Admitting: Urology

## 2022-08-08 ENCOUNTER — Emergency Department (HOSPITAL_BASED_OUTPATIENT_CLINIC_OR_DEPARTMENT_OTHER): Payer: Medicare Other

## 2022-08-08 ENCOUNTER — Other Ambulatory Visit: Payer: Self-pay

## 2022-08-08 DIAGNOSIS — U071 COVID-19: Secondary | ICD-10-CM | POA: Diagnosis not present

## 2022-08-08 DIAGNOSIS — E876 Hypokalemia: Secondary | ICD-10-CM | POA: Diagnosis not present

## 2022-08-08 DIAGNOSIS — R519 Headache, unspecified: Secondary | ICD-10-CM | POA: Diagnosis present

## 2022-08-08 DIAGNOSIS — E871 Hypo-osmolality and hyponatremia: Secondary | ICD-10-CM | POA: Diagnosis not present

## 2022-08-08 DIAGNOSIS — J029 Acute pharyngitis, unspecified: Secondary | ICD-10-CM | POA: Diagnosis not present

## 2022-08-08 LAB — CBC WITH DIFFERENTIAL/PLATELET
Abs Immature Granulocytes: 0.02 10*3/uL (ref 0.00–0.07)
Basophils Absolute: 0 10*3/uL (ref 0.0–0.1)
Basophils Relative: 0 %
Eosinophils Absolute: 0.1 10*3/uL (ref 0.0–0.5)
Eosinophils Relative: 1 %
HCT: 36.7 % (ref 36.0–46.0)
Hemoglobin: 12.3 g/dL (ref 12.0–15.0)
Immature Granulocytes: 0 %
Lymphocytes Relative: 23 %
Lymphs Abs: 1.4 10*3/uL (ref 0.7–4.0)
MCH: 29.6 pg (ref 26.0–34.0)
MCHC: 33.5 g/dL (ref 30.0–36.0)
MCV: 88.2 fL (ref 80.0–100.0)
Monocytes Absolute: 0.6 10*3/uL (ref 0.1–1.0)
Monocytes Relative: 10 %
Neutro Abs: 4 10*3/uL (ref 1.7–7.7)
Neutrophils Relative %: 66 %
Platelets: 161 10*3/uL (ref 150–400)
RBC: 4.16 MIL/uL (ref 3.87–5.11)
RDW: 12.3 % (ref 11.5–15.5)
WBC: 6.1 10*3/uL (ref 4.0–10.5)
nRBC: 0 % (ref 0.0–0.2)

## 2022-08-08 LAB — BASIC METABOLIC PANEL
Anion gap: 7 (ref 5–15)
BUN: 12 mg/dL (ref 8–23)
CO2: 21 mmol/L — ABNORMAL LOW (ref 22–32)
Calcium: 8.3 mg/dL — ABNORMAL LOW (ref 8.9–10.3)
Chloride: 104 mmol/L (ref 98–111)
Creatinine, Ser: 0.67 mg/dL (ref 0.44–1.00)
GFR, Estimated: >60 mL/min (ref >60)
Glucose, Bld: 113 mg/dL — ABNORMAL HIGH (ref 70–99)
Potassium: 3.3 mmol/L — ABNORMAL LOW (ref 3.5–5.1)
Sodium: 132 mmol/L — ABNORMAL LOW (ref 135–145)

## 2022-08-08 LAB — RESP PANEL BY RT-PCR (FLU A&B, COVID) ARPGX2
Influenza A by PCR: NEGATIVE
Influenza B by PCR: NEGATIVE
SARS Coronavirus 2 by RT PCR: POSITIVE — AB

## 2022-08-08 MED ORDER — FLUTICASONE PROPIONATE 50 MCG/ACT NA SUSP: 1.0000 | Freq: Every day | NASAL | 2 refills | Status: DC

## 2022-08-08 MED ORDER — LORATADINE 10 MG PO TABS: 10.0000 mg | ORAL_TABLET | Freq: Every day | ORAL | 0 refills | Status: DC

## 2022-08-08 MED ORDER — POTASSIUM CHLORIDE CRYS ER 20 MEQ PO TBCR
40.0000 meq | EXTENDED_RELEASE_TABLET | Freq: Once | ORAL | Status: AC
  Administered 2022-08-08: 40 meq via ORAL
  Filled 2022-08-08: qty 2

## 2022-08-08 MED ORDER — ONDANSETRON 4 MG PO TBDP
4.0000 mg | ORAL_TABLET | Freq: Once | ORAL | Status: AC | PRN
  Administered 2022-08-08: 4 mg via ORAL
  Filled 2022-08-08: qty 1

## 2022-08-08 MED ORDER — SODIUM CHLORIDE 0.9 % IV BOLUS
1000.0000 mL | Freq: Once | INTRAVENOUS | Status: AC
  Administered 2022-08-08: 1000 mL via INTRAVENOUS

## 2022-08-08 MED ORDER — NIRMATRELVIR/RITONAVIR (PAXLOVID)TABLET: 3.0000 | ORAL_TABLET | Freq: Two times a day (BID) | ORAL | 0 refills | Status: AC

## 2022-08-08 NOTE — ED Provider Notes (Signed)
[facility] EMERGENCY DEPARTMENT Provider Note   CSN: [id number] Arrival date & time: 08/08/22  1256     History  Chief Complaint  Patient presents with   Flu like Symptoms     Wendy [name] is a 71 y.o. female.  With a history of rheumatoid arthritis, fibromyalgia, hypercholesterolemia who presents to the ED for evaluation of headache, sore throat, nonproductive cough, weakness, congestion, rhinorrhea.  Symptoms began 2 days ago.  Patient states that she works at [employer] and is exposed to sick people all day.  States she had COVID 2 years ago and had almost the exact same symptoms.  Has had weakness since early this morning.  She states she got up to use the bathroom and fell over her dog.  She fell forward onto her abdomen.  Reports no pain from the fall.  Did not hit her head or lose consciousness.  Has been able to ambulate with a walker at home today.  Also reports tinnitus in the right ear and feeling unsteady on her feet.  Denies chest pain, shortness of breath, abdominal pain, nausea, vomiting, dizziness  HPI     Home Medications Prior to Admission medications   Medication Sig Start Date End Date Taking? Authorizing Provider  fluticasone (FLONASE) 50 MCG/ACT nasal spray Place 1 spray into both nostrils daily. 08/08/22  Yes Kaito Schulenburg, Grafton Folk, PA-C  loratadine (CLARITIN) 10 MG tablet Take 1 tablet (10 mg total) by mouth daily. 08/08/22 09/07/22 Yes Liat Mayol, Grafton Folk, PA-C  nirmatrelvir/ritonavir EUA (PAXLOVID) 20 x 150 MG & 10 x '100MG'$  TABS Take 3 tablets by mouth 2 (two) times daily for 5 days. Patient GFR is 60. Take nirmatrelvir (150 mg) two tablets twice daily for 5 days and ritonavir (100 mg) one tablet twice daily for 5 days. 08/08/22 08/13/22 Yes Tivis Wherry, Grafton Folk, PA-C  amitriptyline (ELAVIL) 100 MG tablet Take 100 mg by mouth at bedtime.    [provider]  aspirin 81 MG tablet Take 81 mg by mouth daily.    [provider]  calcium carbonate  (OS-CAL - DOSED IN MG OF ELEMENTAL CALCIUM) 1250 (500 Ca) MG tablet Take 1 tablet by mouth.    [provider]  clonazePAM (KLONOPIN) 1 MG tablet Take 1 mg by mouth 2 (two) times daily as needed.    [provider]  diclofenac sodium (VOLTAREN) 1 % GEL Apply 2 g topically 4 (four) times daily. Rub into affected area of foot 2 to 4 times daily 04/26/19   Trula Slade, DPM  famotidine (PEPCID) 20 MG tablet famotidine 20 mg tablet  Take 1 tablet twice a day by oral route.    [provider]  ferrous sulfate 325 (65 FE) MG tablet daily.    [provider]  gabapentin (NEURONTIN) 300 MG capsule Take 300 mg by mouth 3 (three) times daily.    [provider]  Omega-3 Fatty Acids (FISH OIL PO) Take 1 capsule by mouth daily.    [provider]  ondansetron (ZOFRAN) 4 MG tablet Take 4 mg by mouth every 8 (eight) hours as needed for nausea or vomiting.    [provider]  sertraline (ZOLOFT) 50 MG tablet Take 50 mg by mouth daily.    [provider]  tiZANidine (ZANAFLEX) 2 MG tablet Take by mouth every 6 (six) hours as needed for muscle spasms.    [provider]  topiramate (TOPAMAX) 50 MG tablet Take 50 mg by mouth 2 (two)  times daily.    [provider]      Allergies    Guaifenesin, Other, Pseudoephedrine, Codeine, Lyrica [pregabalin], Mirapex [pramipexole dihydrochloride], Trazodone, and Guaifenesin er    Review of Systems   Review of Systems  HENT:  Positive for sore throat.   Respiratory:  Positive for cough.   Neurological:  Positive for weakness and headaches.  All other systems reviewed and are negative.   Physical Exam Updated Vital Signs BP (!) 166/98 (BP Location: Right Arm)   Pulse 79   Temp 98.9 F (37.2 C) (Oral)   Resp 18   Ht '5\' 3"'$  (1.6 m)   Wt 68 kg   SpO2 100%   BMI 26.57 kg/m  Physical Exam  ED Results / Procedures / Treatments   Labs (all labs ordered are listed, but  only abnormal results are displayed) Labs Reviewed  RESP PANEL BY RT-PCR (FLU A&B, COVID) ARPGX2 - Abnormal; Notable for the following components:      Result Value   SARS Coronavirus 2 by RT PCR POSITIVE (*)    All other components within normal limits  BASIC METABOLIC PANEL - Abnormal; Notable for the following components:   Sodium 132 (*)    Potassium 3.3 (*)    CO2 21 (*)    Glucose, Bld 113 (*)    Calcium 8.3 (*)    All other components within normal limits  CBC WITH DIFFERENTIAL/PLATELET    EKG None  Radiology DG Chest Port 1 View  Result Date: 08/08/2022 CLINICAL DATA:  Sore throat.  COVID positive EXAM: PORTABLE CHEST 1 VIEW COMPARISON:  None Available. FINDINGS: Normal mediastinum and cardiac silhouette. Normal pulmonary vasculature. No evidence of effusion, infiltrate, or pneumothorax. Mild scarring at the LEFT lung base. No acute bony abnormality. IMPRESSION: No acute cardiopulmonary process. Electronically Signed   By: Suzy Bouchard M.D.   On: 08/08/2022 16:04    Procedures Procedures    Medications Ordered in ED Medications  ondansetron (ZOFRAN-ODT) disintegrating tablet 4 mg (4 mg Oral Given 08/08/22 1521)  sodium chloride 0.9 % bolus 1,000 mL (0 mLs Intravenous Stopped 08/08/22 1707)  potassium chloride SA (KLOR-CON M) CR tablet 40 mEq (40 mEq Oral Given 08/08/22 1635)    ED Course/ Medical Decision Making/ A&P Clinical Course as of 08/08/22 1803  Thu Aug 08, 2022  1803 Ambulated without difficulty and with 99% on pulse ox [AS]    Clinical Course User Index [AS] Claudie Fisherman Grafton Folk, PA-C                           Medical Decision Making Amount and/or Complexity of Data Reviewed Labs: ordered. Radiology: ordered.  Risk Prescription drug management.  This patient presents to the ED for concern of COVID-like illness, this involves an extensive number of treatment options, and is a complaint that carries with it a high risk of complications and  morbidity.  The differential diagnosis includes COVID, influenza, other URI   Co morbidities that complicate the patient evaluation  rheumatoid arthritis, fibromyalgia, hypercholesterolemia  My initial workup includes respiratory panel, basic labs, chest x-ray  Additional history obtained from: Nursing notes from this visit.  I ordered, reviewed and interpreted labs which include: Respiratory panel, CBC, BMP.  COVID-positive.  Hyponatremia of 132.  Hypokalemia of 3.3.   I ordered imaging studies including chest x-ray I independently visualized and interpreted imaging which showed normal I agree with the radiologist interpretation  Afebrile, slightly hypertensive.  71 year old female presenting to ED for evaluation of flulike symptoms.  Patient did test positive for COVID today.  Physical exam remarkable for congestion and rhinorrhea.  There are no adventitious breath sounds or lower extremity edema.  Lab workup remarkable for hyponatremia and hypokalemia which were addressed in the ED.  Chest x-ray normal.  Due to his age and comorbidities patient will be started on Paxlovid.  Drug drug interactions were checked and there are none.  Patient will also be sent prescriptions for Claritin and Flonase.  She ambulated in the ED without difficulty.  Gave patient return precautions.  encourage her to eat and drink normal diet.  Encouraged her to follow-up with her primary care provider.  Stable at discharge.  At this time there does not appear to be any evidence of an acute emergency medical condition and the patient appears stable for discharge with appropriate outpatient follow up. Diagnosis was discussed with patient who verbalizes understanding of care plan and is agreeable to discharge. I have discussed return precautions with patient and husband who verbalizes understanding. Patient encouraged to follow-up with their PCP within 1 week. All questions answered.  Patient's case discussed with Dr.  Maylon Peppers who agrees with plan to discharge with follow-up.   Note: Portions of this report may have been transcribed using voice recognition software. Every effort was made to ensure accuracy; however, inadvertent computerized transcription errors may still be present.          Final Clinical Impression(s) / ED Diagnoses Final diagnoses:  COVID-19  Hyponatremia  Hypokalemia    Rx / DC Orders ED Discharge Orders          Ordered    nirmatrelvir/ritonavir EUA (PAXLOVID) 20 x 150 MG & 10 x '100MG'$  TABS  2 times daily        08/08/22 1745    loratadine (CLARITIN) 10 MG tablet  Daily        08/08/22 1745    fluticasone (FLONASE) 50 MCG/ACT nasal spray  Daily        08/08/22 1745              Roylene Reason, PA-C 08/08/22 1803    Leanord Asal K, DO 08/08/22 2358

## 2022-08-08 NOTE — ED Triage Notes (Addendum)
Pt states ha, sore throat, right ear buzzing, cough, and weakness that started yesterday  [address] going to [facility] yesterday(mechanical), denies hitting head or LOC

## 2022-08-08 NOTE — Discharge Instructions (Signed)
You have been seen today for your complaint of COVID. Your lab work showed low potassium and low sodium.  These were replaced in the ED. Your imaging showed no pneumonia. Your discharge medications include Paxlovid.  This is a COVID medication.  You should take it as prescribed. Claritin and Flonase.  These are medicines used to treat congestion and runny nose.  You should follow instructions on the packaging. Home care instructions are as follows:  Continue to eat and drink normal diet.  If you feel weak, you should use an assistive device for walking Follow up with: Your primary care provider in 7 days Please seek immediate medical care if you develop any of the following symptoms:  At this time there does not appear to be the presence of an emergent medical condition, however there is always the potential for conditions to change. Please read and follow the below instructions.  Do not take your medicine if  develop an itchy rash, swelling in your mouth or lips, or difficulty breathing; call 911 and seek immediate emergency medical attention if this occurs.  You may review your lab tests and imaging results in their entirety on your MyChart account.  Please discuss all results of fully with your primary care provider and other specialist at your follow-up visit.  Note: Portions of this text may have been transcribed using voice recognition software. Every effort was made to ensure accuracy; however, inadvertent computerized transcription errors may still be present.

## 2022-08-22 DIAGNOSIS — M8580 Other specified disorders of bone density and structure, unspecified site: Secondary | ICD-10-CM | POA: Diagnosis not present

## 2022-08-22 DIAGNOSIS — Z8639 Personal history of other endocrine, nutritional and metabolic disease: Secondary | ICD-10-CM | POA: Diagnosis not present

## 2022-08-22 DIAGNOSIS — Z0000 Encounter for general adult medical examination without abnormal findings: Secondary | ICD-10-CM | POA: Diagnosis not present

## 2022-08-22 DIAGNOSIS — H6993 Unspecified Eustachian tube disorder, bilateral: Secondary | ICD-10-CM | POA: Diagnosis not present

## 2022-08-22 DIAGNOSIS — N183 Chronic kidney disease, stage 3 unspecified: Secondary | ICD-10-CM | POA: Diagnosis not present

## 2022-08-22 DIAGNOSIS — E7800 Pure hypercholesterolemia, unspecified: Secondary | ICD-10-CM | POA: Diagnosis not present

## 2022-08-22 DIAGNOSIS — G8929 Other chronic pain: Secondary | ICD-10-CM | POA: Diagnosis not present

## 2022-08-22 DIAGNOSIS — Z23 Encounter for immunization: Secondary | ICD-10-CM | POA: Diagnosis not present

## 2022-08-22 DIAGNOSIS — M797 Fibromyalgia: Secondary | ICD-10-CM | POA: Diagnosis not present

## 2022-08-22 DIAGNOSIS — G2581 Restless legs syndrome: Secondary | ICD-10-CM | POA: Diagnosis not present

## 2022-08-22 DIAGNOSIS — M170 Bilateral primary osteoarthritis of knee: Secondary | ICD-10-CM | POA: Diagnosis not present

## 2022-09-05 DIAGNOSIS — J342 Deviated nasal septum: Secondary | ICD-10-CM | POA: Diagnosis not present

## 2022-09-05 DIAGNOSIS — H93293 Other abnormal auditory perceptions, bilateral: Secondary | ICD-10-CM | POA: Diagnosis not present

## 2022-09-12 DIAGNOSIS — H9313 Tinnitus, bilateral: Secondary | ICD-10-CM | POA: Diagnosis not present

## 2022-09-12 DIAGNOSIS — H903 Sensorineural hearing loss, bilateral: Secondary | ICD-10-CM | POA: Diagnosis not present

## 2022-11-14 DIAGNOSIS — Z1231 Encounter for screening mammogram for malignant neoplasm of breast: Secondary | ICD-10-CM | POA: Diagnosis not present

## 2022-11-14 DIAGNOSIS — M8589 Other specified disorders of bone density and structure, multiple sites: Secondary | ICD-10-CM | POA: Diagnosis not present

## 2022-11-14 DIAGNOSIS — Z780 Asymptomatic menopausal state: Secondary | ICD-10-CM | POA: Diagnosis not present

## 2022-12-12 DIAGNOSIS — M1711 Unilateral primary osteoarthritis, right knee: Secondary | ICD-10-CM | POA: Diagnosis not present

## 2022-12-26 DIAGNOSIS — Z01818 Encounter for other preprocedural examination: Secondary | ICD-10-CM | POA: Diagnosis not present

## 2022-12-26 DIAGNOSIS — E7800 Pure hypercholesterolemia, unspecified: Secondary | ICD-10-CM | POA: Diagnosis not present

## 2022-12-26 DIAGNOSIS — R300 Dysuria: Secondary | ICD-10-CM | POA: Diagnosis not present

## 2022-12-26 DIAGNOSIS — M25561 Pain in right knee: Secondary | ICD-10-CM | POA: Diagnosis not present

## 2022-12-26 DIAGNOSIS — K5909 Other constipation: Secondary | ICD-10-CM | POA: Diagnosis not present

## 2023-01-09 ENCOUNTER — Ambulatory Visit (INDEPENDENT_AMBULATORY_CARE_PROVIDER_SITE_OTHER): Payer: Medicare Other

## 2023-01-09 ENCOUNTER — Ambulatory Visit: Payer: Medicare Other | Admitting: Podiatry

## 2023-01-09 DIAGNOSIS — M778 Other enthesopathies, not elsewhere classified: Secondary | ICD-10-CM

## 2023-01-09 DIAGNOSIS — D3613 Benign neoplasm of peripheral nerves and autonomic nervous system of lower limb, including hip: Secondary | ICD-10-CM | POA: Diagnosis not present

## 2023-01-09 MED ORDER — MELOXICAM 7.5 MG PO TABS: 7.5000 mg | ORAL_TABLET | Freq: Every day | ORAL | 0 refills | Status: DC | PRN

## 2023-01-09 NOTE — Patient Instructions (Signed)
Morton Neuralgia  Morton neuralgia is foot pain that affects the ball of the foot and the area near the toes. Morton neuralgia occurs when part of a nerve in the foot (digital nerve) is under too much pressure (compressed). When this happens over a long period of time, the nerve can thicken (neuroma) and cause pain. Pain usually occurs between the third and fourth toes.  Morton neuralgia can come and go but may get worse over time. What are the causes? This condition is caused by doing the same things over and over with your foot, such as: Activities such as running or jumping. Wearing shoes that are too tight. What increases the risk? You may be at higher risk for Morton neuralgia if you: Are female. Wear high heels. Wear shoes that are narrow or tight. Do activities that repeatedly stretch your toes, such as: Running. Ballet. Long-distance walking. What are the signs or symptoms? The first symptom of Morton neuralgia is pain that spreads from the ball of the foot to the toes. It may feel like you are walking on a marble. Pain usually gets worse with walking and goes away at night. Other symptoms may include numbness and cramping of your toes. Both feet are equally affected, but rarely at the same time. How is this diagnosed? This condition is diagnosed based on your symptoms, your medical history, and a physical exam. Your health care provider may: Squeeze your foot just behind your toe. Ask you to move your toes to check for pain. Ask about your physical activity level. You also may have imaging tests, such as an X-ray, ultrasound, or MRI. How is this treated? Treatment depends on how severe your condition is and what causes it. Treatment may involve: Wearing different shoes that are not too tight, are low-heeled, and provide good support. For some people, this is the only treatment needed. Wearing an over-the-counter or custom supportive pad (orthotic) under the front of your  foot. Getting injections of numbing medicine and anti-inflammatory medicine (steroid) in the nerve. Having surgery to remove part of the thickened nerve. Follow these instructions at home: Managing pain, stiffness, and swelling  Massage your foot as needed. Wear orthotics as told by your health care provider. If directed, put ice on the painful area. To do this: Put ice in a plastic bag. Place a towel between your skin and the bag. Leave the ice on for 20 minutes, 2-3 times a day. Remove the ice if your skin turns bright red. This is very important. If you cannot feel pain, heat, or cold, you have a greater risk of damage to the area. Raise (elevate) the injured area above the level of your heart while you are sitting or lying down. Avoid activities that cause pain or make pain worse. If you play sports, ask your health care provider when it is safe for you to return to sports. General instructions Take over-the-counter and prescription medicines only as told by your health care provider. For the time period you were told by your health care provider, do not drive or use machinery. Wear shoes that: Have soft soles. Have a wide toe area. Provide arch support. Do not pinch or squeeze your feet. Have room for your orthotics, if this applies. Keep all follow-up visits. This is important. Contact a health care provider if: Your symptoms get worse or do not get better with treatment and home care. Summary Morton neuralgia is foot pain that affects the ball of the foot and the   area near the toes. Pain usually occurs between the third and fourth toes, gets worse with walking, and goes away at night. Morton neuralgia occurs when part of a nerve in the foot (digital nerve) is under too much pressure. When this happens over a long period of time, the nerve can thicken (neuroma) and cause pain. This condition is caused by doing the same things over and over with your foot, such as running or  jumping, wearing shoes that are too tight, or wearing high heels. Treatment may involve wearing low-heeled shoes that are not too tight, wearing a supportive pad (orthotic) under the front of your foot, getting injections in the nerve, or having surgery to remove part of the thickened nerve. This information is not intended to replace advice given to you by your health care provider. Make sure you discuss any questions you have with your health care provider. Document Revised: 01/26/2021 Document Reviewed: 01/26/2021 Elsevier Patient Education  2023 [employer]  

## 2023-01-09 NOTE — Progress Notes (Signed)
Subjective:   Patient ID: Wendy [name], female   DOB: 72 y.o.   MRN: [id number]   HPI Chief Complaint  Patient presents with   Foot Problem    Left foot has knot on top, numbness in toes   72 year old female with above concerns. She feels that she has a knot in her toes and she has numbness to the toes as well.  No recent injuries that she reports.  This been ongoing for about a year.  No recent treatment.   Review of Systems  All other systems reviewed and are negative.  Past Medical History:  Diagnosis Date   Fibromyalgia    Hypercholesteremia    Migraine    Restless leg syndrome    Rheumatoid arthritis(714.0)     Past Surgical History:  Procedure Laterality Date   ABDOMINAL HYSTERECTOMY     BREAST SURGERY     breast implants and removal   COLON SURGERY     FOOT SURGERY       Current Outpatient Medications:    meloxicam (MOBIC) 7.5 MG tablet, Take 1 tablet (7.5 mg total) by mouth daily as needed for pain., Disp: 14 tablet, Rfl: 0   amitriptyline (ELAVIL) 100 MG tablet, Take 100 mg by mouth at bedtime., Disp: , Rfl:    aspirin 81 MG tablet, Take 81 mg by mouth daily., Disp: , Rfl:    calcium carbonate (OS-CAL - DOSED IN MG OF ELEMENTAL CALCIUM) 1250 (500 Ca) MG tablet, Take 1 tablet by mouth., Disp: , Rfl:    clonazePAM (KLONOPIN) 1 MG tablet, Take 1 mg by mouth 2 (two) times daily as needed., Disp: , Rfl:    diclofenac sodium (VOLTAREN) 1 % GEL, Apply 2 g topically 4 (four) times daily. Rub into affected area of foot 2 to 4 times daily, Disp: 100 g, Rfl: 2   famotidine (PEPCID) 20 MG tablet, famotidine 20 mg tablet  Take 1 tablet twice a day by oral route., Disp: , Rfl:    ferrous sulfate 325 (65 FE) MG tablet, daily., Disp: , Rfl:    fluticasone (FLONASE) 50 MCG/ACT nasal spray, Place 1 spray into both nostrils daily., Disp: 15.8 mL, Rfl: 2   gabapentin (NEURONTIN) 300 MG capsule, Take 300 mg by mouth 3 (three) times daily., Disp: , Rfl:    loratadine (CLARITIN) 10 MG  tablet, Take 1 tablet (10 mg total) by mouth daily., Disp: 30 tablet, Rfl: 0   Omega-3 Fatty Acids (FISH OIL PO), Take 1 capsule by mouth daily., Disp: , Rfl:    ondansetron (ZOFRAN) 4 MG tablet, Take 4 mg by mouth every 8 (eight) hours as needed for nausea or vomiting., Disp: , Rfl:    sertraline (ZOLOFT) 50 MG tablet, Take 50 mg by mouth daily., Disp: , Rfl:    tiZANidine (ZANAFLEX) 2 MG tablet, Take by mouth every 6 (six) hours as needed for muscle spasms., Disp: , Rfl:    topiramate (TOPAMAX) 50 MG tablet, Take 50 mg by mouth 2 (two) times daily., Disp: , Rfl:   Allergies  Allergen Reactions   Guaifenesin Anaphylaxis   Other Anaphylaxis    anestesthia   Pseudoephedrine Anaphylaxis   Codeine Nausea And Vomiting   Lyrica [Pregabalin] Nausea And Vomiting   Mirapex [Pramipexole Dihydrochloride] Nausea And Vomiting   Trazodone Nausea And Vomiting   Guaifenesin Er           Objective:  Physical Exam  General: AAO x3, NAD  Dermatological: Skin is warm, dry  and supple bilateral. There are no open sores, no preulcerative lesions, no rash or signs of infection present.  Vascular: Dorsalis Pedis artery and Posterior Tibial artery pedal pulses are 2/4 bilateral with immedate capillary fill time.  There is no pain with calf compression, swelling, warmth, erythema.   Neruologic: Grossly intact via light touch bilateral.   Musculoskeletal: Tenderness palpation on the interspace and small palpable neuroma is noted she is also developing nerve symptoms in the associated toes.  There is no area pinpoint tenderness.  There is slight edema but no erythema or warmth.  No pain with imaging range of motion or any instability present.  Muscular strength 5/5 in all groups tested bilateral.  Gait: Unassisted, Nonantalgic.       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  3 views left foot were obtained.  No evidence of acute fracture. -Prescribed mobic. Discussed side effects of the medication and directed to stop if any are to occur and call the office.  -Discussed steroid injection -Offloading pads -Supportive shoes.   Return if symptoms worsen or fail to improve.  Vivi Barrack DPM

## 2023-02-25 DIAGNOSIS — M25562 Pain in left knee: Secondary | ICD-10-CM | POA: Diagnosis not present

## 2023-02-25 DIAGNOSIS — M25662 Stiffness of left knee, not elsewhere classified: Secondary | ICD-10-CM | POA: Diagnosis not present

## 2023-02-25 DIAGNOSIS — M1712 Unilateral primary osteoarthritis, left knee: Secondary | ICD-10-CM | POA: Diagnosis not present

## 2023-03-04 NOTE — Patient Instructions (Addendum)
SURGICAL WAITING ROOM VISITATION Patients having surgery or a procedure may have no more than 2 support people in the waiting area - these visitors may rotate.    Children under the age of 34 must have an adult with them who is not the patient.  If the patient needs to stay at the hospital during part of their recovery, the visitor guidelines for inpatient rooms apply. Pre-op nurse will coordinate an appropriate time for 1 support person to accompany patient in pre-op.  This support person may not rotate.    Please refer to the [facility] website for the visitor guidelines for Inpatients (after your surgery is over and you are in a regular room).       Your procedure is scheduled on: 03-24-23   Report to [facility] Main Entrance    Report to admitting at 5:15 AM   Call this number if you have problems the morning of surgery [phone]   Do not eat food :After Midnight.   After Midnight you may have the following liquids until 4:15 AM DAY OF SURGERY  Water Non-Citrus Juices (without pulp, NO RED-Apple, White grape, White cranberry) Black Coffee (NO MILK/CREAM OR CREAMERS, sugar ok)  Clear Tea (NO MILK/CREAM OR CREAMERS, sugar ok) regular and decaf                             Plain Jell-O (NO RED)                                           Fruit ices (not with fruit pulp, NO RED)                                     Popsicles (NO RED)                                                               Sports drinks like Gatorade (NO RED)                   The day of surgery:  Drink ONE (1) Pre-Surgery Clear Ensure at 4:15 AM the morning of surgery. Drink in one sitting. Do not sip.  This drink was given to you during your hospital  pre-op appointment visit. Nothing else to drink after completing the Pre-Surgery Clear Ensure          If you have questions, please contact your surgeon's office.   FOLLOW BANY ADDITIONAL PRE OP INSTRUCTIONS YOU RECEIVED FROM YOUR SURGEON'S  OFFICE!!!     Oral Hygiene is also important to reduce your risk of infection.                                    Remember - BRUSH YOUR TEETH THE MORNING OF SURGERY WITH YOUR REGULAR TOOTHPASTE   Do NOT smoke after Midnight   Take these medicines the morning of surgery with A SIP OF WATER:   Pepcid  Claritin  Sertraline  Ondansetron if needed                              You may not have any metal on your body including hair pins, jewelry, and body piercing             Do not wear make-up, lotions, powders, perfumes or deodorant  Do not wear nail polish including gel and S&S, artificial/acrylic nails, or any other type of covering on natural nails including finger and toenails. If you have artificial nails, gel coating, etc. that needs to be removed by a nail salon please have this removed prior to surgery or surgery may need to be canceled/ delayed if the surgeon/ anesthesia feels like they are unable to be safely monitored.   Do not shave  48 hours prior to surgery.    Do not bring valuables to the hospital. [facility] IS NOT RESPONSIBLE   FOR VALUABLES.   Contacts, dentures or bridgework may not be worn into surgery.   Bring small overnight bag day of surgery.   DO NOT BRING YOUR HOME MEDICATIONS TO THE HOSPITAL. PHARMACY WILL DISPENSE MEDICATIONS LISTED ON YOUR MEDICATION LIST TO YOU DURING YOUR ADMISSION IN THE HOSPITAL!               Please read over the following fact sheets you were given: IF YOU HAVE QUESTIONS ABOUT YOUR PRE-OP INSTRUCTIONS PLEASE CALL [phone] G[facility]  If you received a COVID test during your pre-op visit  it is requested that you wear a mask when out in public, stay away from anyone that may not be feeling well and notify your surgeon if you develop symptoms. If you test positive for Covid or have been in contact with anyone that has tested positive in the last 10 days please notify you surgeon.    Pre-operative 5 CHG Bath Instructions   You can  play a key role in reducing the risk of infection after surgery. Your skin needs to be as free of germs as possible. You can reduce the number of germs on your skin by washing with CHG (chlorhexidine gluconate) soap before surgery. CHG is an antiseptic soap that kills germs and continues to kill germs even after washing.   DO NOT use if you have an allergy to chlorhexidine/CHG or antibacterial soaps. If your skin becomes reddened or irritated, stop using the CHG and notify one of our RNs at  [phone] .   Please shower with the CHG soap starting 4 days before surgery using the following schedule:     Please keep in mind the following:  DO NOT shave, including legs and underarms, starting the day of your first shower.   You may shave your face at any point before/day of surgery.  Place clean sheets on your bed the day you start using CHG soap. Use a clean washcloth (not used since being washed) for each shower. DO NOT sleep with pets once you start using the CHG.   CHG Shower Instructions:  If you choose to wash your hair and private area, wash first with your normal shampoo/soap.  After you use shampoo/soap, rinse your hair and body thoroughly to remove shampoo/soap residue.  Turn the water OFF and apply about 3 tablespoons (45 ml) of CHG soap to a CLEAN washcloth.  Apply CHG soap ONLY FROM YOUR NECK DOWN TO YOUR TOES (washing for 3-5 minutes)  DO NOT use CHG soap on  face, private areas, open wounds, or sores.  Pay special attention to the area where your surgery is being performed.  If you are having back surgery, having someone wash your back for you may be helpful. Wait 2 minutes after CHG soap is applied, then you may rinse off the CHG soap.  Pat dry with a clean towel  Put on clean clothes/pajamas   If you choose to wear lotion, please use ONLY the CHG-compatible lotions on the back of this paper.     Additional instructions for the day of surgery: DO NOT APPLY any lotions,  deodorants, cologne, or perfumes.   Put on clean/comfortable clothes.  Brush your teeth.  Ask your nurse before applying any prescription medications to the skin.      CHG Compatible Lotions   Aveeno Moisturizing lotion  Cetaphil Moisturizing Cream  Cetaphil Moisturizing Lotion  Clairol Herbal Essence Moisturizing Lotion, Dry Skin  Clairol Herbal Essence Moisturizing Lotion, Extra Dry Skin  Clairol Herbal Essence Moisturizing Lotion, Normal Skin  Curel Age Defying Therapeutic Moisturizing Lotion with Alpha Hydroxy  Curel Extreme Care Body Lotion  Curel Soothing Hands Moisturizing Hand Lotion  Curel Therapeutic Moisturizing Cream, Fragrance-Free  Curel Therapeutic Moisturizing Lotion, Fragrance-Free  Curel Therapeutic Moisturizing Lotion, Original Formula  Eucerin Daily Replenishing Lotion  Eucerin Dry Skin Therapy Plus Alpha Hydroxy Crme  Eucerin Dry Skin Therapy Plus Alpha Hydroxy Lotion  Eucerin Original Crme  Eucerin Original Lotion  Eucerin Plus Crme Eucerin Plus Lotion  Eucerin TriLipid Replenishing Lotion  Keri Anti-Bacterial Hand Lotion  Keri Deep Conditioning Original Lotion Dry Skin Formula Softly Scented  Keri Deep Conditioning Original Lotion, Fragrance Free Sensitive Skin Formula  Keri Lotion Fast Absorbing Fragrance Free Sensitive Skin Formula  Keri Lotion Fast Absorbing Softly Scented Dry Skin Formula  Keri Original Lotion  Keri Skin Renewal Lotion Keri Silky Smooth Lotion  Keri Silky Smooth Sensitive Skin Lotion  Nivea Body Creamy Conditioning Oil  Nivea Body Extra Enriched Lotion  Nivea Body Original Lotion  Nivea Body Sheer Moisturizing Lotion Nivea Crme  Nivea Skin Firming Lotion  NutraDerm 30 Skin Lotion  NutraDerm Skin Lotion  NutraDerm Therapeutic Skin Cream  NutraDerm Therapeutic Skin Lotion  ProShield Protective Hand Cream  Provon moisturizing lotion   PATIENT SIGNATURE_________________________________  NURSE  SIGNATURE__________________________________  ________________________________________________________________________    Rogelia Mire  An incentive spirometer is a tool that can help keep your lungs clear and active. This tool measures how well you are filling your lungs with each breath. Taking long deep breaths may help reverse or decrease the chance of developing breathing (pulmonary) problems (especially infection) following: A long period of time when you are unable to move or be active. BEFORE THE PROCEDURE  If the spirometer includes an indicator to show your best effort, your nurse or respiratory therapist will set it to a desired goal. If possible, sit up straight or lean slightly forward. Try not to slouch. Hold the incentive spirometer in an upright position. INSTRUCTIONS FOR USE  Sit on the edge of your bed if possible, or sit up as far as you can in bed or on a chair. Hold the incentive spirometer in an upright position. Breathe out normally. Place the mouthpiece in your mouth and seal your lips tightly around it. Breathe in slowly and as deeply as possible, raising the piston or the ball toward the top of the column. Hold your breath for 3-5 seconds or for as long as possible. Allow the piston or ball to fall to  the bottom of the column. Remove the mouthpiece from your mouth and breathe out normally. Rest for a few seconds and repeat Steps 1 through 7 at least 10 times every 1-2 hours when you are awake. Take your time and take a few normal breaths between deep breaths. The spirometer may include an indicator to show your best effort. Use the indicator as a goal to work toward during each repetition. After each set of 10 deep breaths, practice coughing to be sure your lungs are clear. If you have an incision (the cut made at the time of surgery), support your incision when coughing by placing a pillow or rolled up towels firmly against it. Once you are able to get out of  bed, walk around indoors and cough well. You may stop using the incentive spirometer when instructed by your caregiver.  RISKS AND COMPLICATIONS Take your time so you do not get dizzy or light-headed. If you are in pain, you may need to take or ask for pain medication before doing incentive spirometry. It is harder to take a deep breath if you are having pain. AFTER USE Rest and breathe slowly and easily. It can be helpful to keep track of a log of your progress. Your caregiver can provide you with a simple table to help with this. If you are using the spirometer at home, follow these instructions: SEEK MEDICAL CARE IF:  You are having difficultly using the spirometer. You have trouble using the spirometer as often as instructed. Your pain medication is not giving enough relief while using the spirometer. You develop fever of 100.5 F (38.1 C) or higher. SEEK IMMEDIATE MEDICAL CARE IF:  You cough up bloody sputum that had not been present before. You develop fever of 102 F (38.9 C) or greater. You develop worsening pain at or near the incision site. MAKE SURE YOU:  Understand these instructions. Will watch your condition. Will get help right away if you are not doing well or get worse. Document Released: 12/30/2006 Document Revised: 11/11/2011 Document Reviewed: 03/02/2007 [facility] Patient Information 2014 [place], [place].   ________________________________________________________________________

## 2023-03-04 NOTE — Progress Notes (Addendum)
COVID Vaccine Completed:  No  Date of COVID positive in last 90 days:  No  PCP - Johny Blamer, MD Cardiologist - Donato Schultz, MD in 2013  Medical clearance on chart dated 12-27-22  Chest x-ray - 08-08-22 Epic EKG - 12-26-22 on chart Stress Test - 2013 for chest pain ECHO - N/A Cardiac Cath - 2013 for chest pain Pacemaker/ICD device last checked: Spinal Cord Stimulator: N/A  Bowel Prep - N/A  Sleep Study - N/A CPAP -   Fasting Blood Sugar - N/A Checks Blood Sugar _____ times a day  Last dose of GLP1 agonist-  N/A GLP1 instructions:  N/A   Last dose of SGLT-2 inhibitors-  N/A SGLT-2 instructions: N/A  Blood Thinner Instructions:  Time Aspirin Instructions:  ASA 81.  Per pt to stop a week before  Last Dose:  Activity level:  Can go up a flight of stairs and perform activities of daily living without stopping and without symptoms of chest pain or shortness of breath.  Anesthesia review:  Hx of chest pain in 2013 evaluated by stress test and cardiac cath.  Patient states that testing was negative and symptoms thought to be related to abdominal adhesions  Patient denies shortness of breath, fever, cough and chest pain at PAT appointment  Patient verbalized understanding of instructions that were given to them at the PAT appointment. Patient was also instructed that they will need to review over the PAT instructions again at home before surgery.

## 2023-03-13 ENCOUNTER — Other Ambulatory Visit: Payer: Self-pay

## 2023-03-13 ENCOUNTER — Encounter (HOSPITAL_COMMUNITY)
Admission: RE | Admit: 2023-03-13 | Discharge: 2023-03-13 | Disposition: A | Discharge status: Home / Self Care | Payer: Medicare Other | Source: Ambulatory Visit | Attending: Orthopedic Surgery | Admitting: Orthopedic Surgery

## 2023-03-13 ENCOUNTER — Encounter (HOSPITAL_COMMUNITY): Payer: Self-pay

## 2023-03-13 VITALS — BP 148/76 | HR 93 | Temp 98.3°F | Resp 12 | Ht 64.0 in | Wt 148.8 lb

## 2023-03-13 DIAGNOSIS — Z01812 Encounter for preprocedural laboratory examination: Secondary | ICD-10-CM | POA: Insufficient documentation

## 2023-03-13 DIAGNOSIS — Z01818 Encounter for other preprocedural examination: Secondary | ICD-10-CM

## 2023-03-13 HISTORY — DX: Other constipation: K59.09

## 2023-03-13 HISTORY — DX: Anxiety disorder, unspecified: F41.9

## 2023-03-13 HISTORY — DX: Family history of other specified conditions: Z84.89

## 2023-03-13 HISTORY — DX: Gastro-esophageal reflux disease without esophagitis: K21.9

## 2023-03-13 HISTORY — DX: Nausea with vomiting, unspecified: Z98.890

## 2023-03-13 HISTORY — DX: Pneumonia, unspecified organism: J18.9

## 2023-03-13 HISTORY — DX: Anemia, unspecified: D64.9

## 2023-03-13 HISTORY — DX: Other specified postprocedural states: R11.2

## 2023-03-13 LAB — CBC
HCT: 40.8 % (ref 36.0–46.0)
Hemoglobin: 13.5 g/dL (ref 12.0–15.0)
MCH: 30.1 pg (ref 26.0–34.0)
MCHC: 33.1 g/dL (ref 30.0–36.0)
MCV: 90.9 fL (ref 80.0–100.0)
Platelets: 238 10*3/uL (ref 150–400)
RBC: 4.49 MIL/uL (ref 3.87–5.11)
RDW: 12.5 % (ref 11.5–15.5)
WBC: 6.9 10*3/uL (ref 4.0–10.5)
nRBC: 0 % (ref 0.0–0.2)

## 2023-03-13 LAB — BASIC METABOLIC PANEL
Anion gap: 10 (ref 5–15)
BUN: 22 mg/dL (ref 8–23)
CO2: 21 mmol/L — ABNORMAL LOW (ref 22–32)
Calcium: 9.2 mg/dL (ref 8.9–10.3)
Chloride: 106 mmol/L (ref 98–111)
Creatinine, Ser: 0.88 mg/dL (ref 0.44–1.00)
GFR, Estimated: >60 mL/min (ref >60)
Glucose, Bld: 102 mg/dL — ABNORMAL HIGH (ref 70–99)
Potassium: 3.6 mmol/L (ref 3.5–5.1)
Sodium: 137 mmol/L (ref 135–145)

## 2023-03-13 LAB — SURGICAL PCR SCREEN
MRSA, PCR: NEGATIVE
Staphylococcus aureus: POSITIVE — AB

## 2023-03-14 NOTE — Progress Notes (Signed)
PCR results sent to Dr. Aluisio to review.  ?

## 2023-03-17 NOTE — H&P (Signed)
TOTAL KNEE ADMISSION H&P  Patient is being admitted for right total knee arthroplasty.  Subjective:  Chief Complaint: Right knee pain.  HPI: Wendy [name], 72 y.o. female has a history of pain and functional disability in the right knee due to arthritis and has failed non-surgical conservative treatments for greater than 12 weeks to include NSAID's and/or analgesics, corticosteriod injections, viscosupplementation injections, use of assistive devices, and activity modification. Onset of symptoms was gradual, starting 4 years ago with gradually worsening course since that time. The patient noted no past surgery on the right knee.  Patient currently rates pain in the right knee at 8 out of 10 with activity. Patient has pain that interferes with activities of daily living. Patient has evidence of subchondral sclerosis, periarticular osteophytes, and joint space narrowing by imaging studies.  There is no active infection.  Patient Active Problem List   Diagnosis Date Noted   Nondisplaced fracture of fifth metatarsal bone, right foot, initial encounter for closed fracture 04/26/2019   Nondisplaced fracture of fifth metatarsal bone, right foot, subsequent encounter for fracture with routine healing 04/26/2019   Pain in right foot 04/26/2019   Osteoarthritis of hip 08/31/2018    Past Medical History:  Diagnosis Date   Anemia    Anxiety    Chronic constipation    Family history of adverse reaction to anesthesia    Mother N&V   Fibromyalgia    GERD (gastroesophageal reflux disease)    Hypercholesteremia    Migraine    Pneumonia    PONV (postoperative nausea and vomiting)    Restless leg syndrome    Rheumatoid arthritis(714.0)     Past Surgical History:  Procedure Laterality Date   ABDOMINAL ADHESION SURGERY     Oophorectomy   ABDOMINAL HYSTERECTOMY     BREAST SURGERY     breast implants and removal   CARDIAC CATHETERIZATION     2013   CARPAL TUNNEL RELEASE Right    CATARACT  EXTRACTION W/ INTRAOCULAR LENS IMPLANT Bilateral    COLONOSCOPY     FOOT SURGERY      Prior to Admission medications   Medication Sig Start Date End Date Taking? Authorizing Provider  amitriptyline (ELAVIL) 75 MG tablet Take 75 mg by mouth at bedtime.   Yes [provider]  ascorbic acid (VITAMIN C) 500 MG tablet Take 500 mg by mouth at bedtime.   Yes [provider]  aspirin 81 MG tablet Take 81 mg by mouth at bedtime.   Yes [provider]  beta carotene 60454 UNIT capsule Take 25,000 Units by mouth at bedtime. 01/29/22  Yes [provider]  bisacodyl (DULCOLAX) 5 MG EC tablet Take 5 mg by mouth daily as needed for mild constipation or moderate constipation. 01/08/22  Yes [provider]  calcium carbonate (OSCAL) 1500 (600 Ca) MG TABS tablet Take 600 mg of elemental calcium by mouth at bedtime.   Yes [provider]  Cholecalciferol (VITAMIN D-3) 125 MCG (5000 UT) TABS Take 5,000 Units by mouth at bedtime.   Yes [provider]  clonazePAM (KLONOPIN) 2 MG tablet Take 2 mg by mouth at bedtime.   Yes [provider]  Cyanocobalamin (VITAMIN B-12) 5000 MCG SUBL Place 5,000 mcg under the tongue at bedtime.   Yes [provider]  ezetimibe (ZETIA) 10 MG tablet Take 10 mg by mouth at bedtime.   Yes [provider]  famotidine (PEPCID) 20 MG tablet Take 20 mg by mouth daily as needed for  heartburn.   Yes [provider]  gabapentin (NEURONTIN) 300 MG capsule Take 300 mg by mouth at bedtime.   Yes [provider]  Garlic 1000 MG CAPS Take 1,000 mg by mouth at bedtime. 12/21/21  Yes [provider]  Ginger, Zingiber officinalis, (GINGER ROOT) 550 MG CAPS Take 550 mg by mouth daily.   Yes [provider]  Glucosamine HCl (GLUCOSAMINE PO) Take 2,000 mg by mouth at bedtime.   Yes [provider]  loratadine (CLARITIN) 10 MG tablet Take 1 tablet (10 mg total) by mouth daily.  08/08/22 03/10/23 Yes Schutt, Edsel Petrin, PA-C  Magnesium 400 MG CAPS Take 400 mg by mouth at bedtime.   Yes [provider]  Menthol, Topical Analgesic, (ICY HOT PAIN RELIEVING) 2.5 % GEL Apply 1 application  topically daily as needed (Knee pain).   Yes [provider]  milk thistle 175 MG tablet Take 175 mg by mouth at bedtime.   Yes [provider]  Omega-3 Fatty Acids (FISH OIL PO) Take 500 mg by mouth at bedtime.   Yes [provider]  ondansetron (ZOFRAN) 4 MG tablet Take 4 mg by mouth every 8 (eight) hours as needed for nausea or vomiting.   Yes [provider]  OVER THE COUNTER MEDICATION Take 1,000 mg by mouth at bedtime. Marshmallow Root   Yes [provider]  Plant Sterols and Stanols (CHOLESTOFF PLUS) 450 MG CAPS Take 420 mg by mouth at bedtime.   Yes [provider]  Potassium 99 MG TABS Take 99 mg by mouth at bedtime.   Yes [provider]  RED YEAST RICE EXTRACT PO Take 1,200 mg by mouth at bedtime.   Yes [provider]  sertraline (ZOLOFT) 50 MG tablet Take 50 mg by mouth at bedtime.   Yes [provider]  tiZANidine (ZANAFLEX) 2 MG tablet Take 2 mg by mouth at bedtime.   Yes [provider]  topiramate (TOPAMAX) 50 MG tablet Take 50 mg by mouth at bedtime.   Yes [provider]  TURMERIC CURCUMIN PO Take 1,500 mg by mouth at bedtime.   Yes [provider]  Zinc Gluconate 50 MG CAPS Take 50 mg by mouth at bedtime. 12/21/21  Yes [provider]  meloxicam (MOBIC) 7.5 MG tablet Take 1 tablet (7.5 mg total) by mouth daily as needed for pain. Patient not taking: Reported on 03/10/2023 01/09/23   Vivi Barrack, DPM    Allergies  Allergen Reactions   Guaifenesin Anaphylaxis   Other Anaphylaxis    anestesthia   Pseudoephedrine Anaphylaxis   Codeine Nausea And Vomiting   Lyrica [Pregabalin] Nausea And Vomiting   Mirapex [Pramipexole Dihydrochloride] Nausea  And Vomiting   Trazodone Nausea And Vomiting   Guaifenesin Er    Pramipexole Anxiety    Other Reaction(s): Angioedema    Social History   Socioeconomic History   Marital status: Married    Spouse name: Not on file   Number of children: Not on file   Years of education: Not on file   Highest education level: Not on file  Occupational History   Not on file  Tobacco Use   Smoking status: Former    Types: Cigarettes   Smokeless tobacco: Never  Vaping Use   Vaping status: Never Used  Substance and Sexual Activity   Alcohol use: Never   Drug use: Never   Sexual activity: Not on file  Other Topics Concern   Not on file  Social History Narrative   Not on file   Social Determinants of Health   Financial Resource Strain: Not on file  Food Insecurity: Not on file  Transportation Needs: Not on file  Physical Activity: Not on file  Stress: Not on file  Social Connections: Not on file  Intimate Partner Violence: Not on file    Tobacco Use: Medium Risk (03/13/2023)   Patient History    Smoking Tobacco Use: Former    Smokeless Tobacco Use: Never    Passive Exposure: Not on file   Social History   Substance and Sexual Activity  Alcohol Use Never    Family History  Problem Relation Age of Onset   Coronary artery disease Mother     ROS  Objective:  Physical Exam: - Well developed, alert, oriented female in no apparent distress.    - Antalgic gait pattern on the right.    - Left knee shows range 0 to 125, minimal medial tenderness, no lateral tenderness or instability.    - Right hip has normal range of motion without discomfort.    - Right knee shows no effusion, range 5 to 125, moderate crepitus on range of motion, tender medially with some lateral tenderness. No instability about the knee.    IMAGING:  Radiographs of both knees (AP and lateral views) were taken today. The radiographs demonstrate advanced osteoarthritis in the right knee. The patient has  bone-on-bone arthritis in the medial compartment of the right knee with near bone-on-bone patellofemoral. There are also large spurs present behind the kneecap. The left knee appears normal.     Assessment/Plan:  End stage arthritis, right knee   The patient history, physical examination, clinical judgment of the provider and imaging studies are consistent with end stage degenerative joint disease of the right knee and total knee arthroplasty is deemed medically necessary. The treatment options including medical management, injection therapy arthroscopy and arthroplasty were discussed at length. The risks and benefits of total knee arthroplasty were presented and reviewed. The risks due to aseptic loosening, infection, stiffness, patella tracking problems, thromboembolic complications and other imponderables were discussed. The patient acknowledged the explanation, agreed to proceed with the plan and consent was signed. Patient is being admitted for inpatient treatment for surgery, pain control, PT, OT, prophylactic antibiotics, VTE prophylaxis, progressive ambulation and ADLs and discharge planning. The patient is planning to be discharged  home .   Patient's anticipated LOS is less than 2 midnights, meeting these requirements: - Lives within 1 hour of care - Has a competent adult at home to recover with post-op recover - NO history of  - Chronic pain requiring opiods  - Diabetes  - Coronary Artery Disease  - Heart failure  - Heart attack  - Stroke  - DVT/VTE  - Cardiac arrhythmia  - Respiratory Failure/COPD  - Renal failure  - Anemia  - Advanced Liver disease    Therapy Plans: [facility] 7/26. Referral faxed. Disposition: Husband Mark. Home with daughter, who will be in town for 9 days. Planned DVT Prophylaxis: Aspirin Protocol (she is unsure if she required additional thinners after adhesion sx in [place]) DME Needed: RW ordered by [facility] PCP: Dr. Deirdre Peer FNP Clearance  Low Risk Optimized medical/cardiac 4/26 Cardiology: None TXA: IV Allergies: Codeine (nausea), Mucinex (RLS), Pseudophed (RLS), Tramadol (Nausea) Anesthesia Concerns: N/V BMI: 25.7 Last HgbA1c: Not diabetic Metal Allergy: None Pharmacy: [facility] [address]  Other: Aspirin daily (preventive) Chronic Constipation (Bisacodyl) - Large bowel obstruction hx [facility] labs  -  Patient was instructed on what medications to stop prior to surgery. - Follow-up visit in 2 weeks with Dr. Lequita Halt - Begin physical therapy following surgery - Pre-operative lab work as pre-surgical testing - Prescriptions will be provided in hospital at time of discharge  Weston Brass, PA-C Orthopedic Surgery [facility] [place]

## 2023-03-24 ENCOUNTER — Ambulatory Visit (HOSPITAL_COMMUNITY): Payer: Medicare Other | Admitting: Physician Assistant

## 2023-03-24 ENCOUNTER — Encounter (HOSPITAL_COMMUNITY): Payer: Self-pay | Admitting: Orthopedic Surgery

## 2023-03-24 ENCOUNTER — Other Ambulatory Visit: Payer: Self-pay

## 2023-03-24 ENCOUNTER — Observation Stay (HOSPITAL_COMMUNITY)
Admission: RE | Admit: 2023-03-24 | Discharge: 2023-03-25 | Disposition: A | Discharge status: Home / Self Care | Payer: Medicare Other | Source: Ambulatory Visit | Attending: Orthopedic Surgery | Admitting: Orthopedic Surgery

## 2023-03-24 ENCOUNTER — Encounter (HOSPITAL_COMMUNITY)
Admission: RE | Disposition: A | Discharge status: Home / Self Care | Payer: Self-pay | Source: Ambulatory Visit | Attending: Orthopedic Surgery

## 2023-03-24 ENCOUNTER — Ambulatory Visit (HOSPITAL_BASED_OUTPATIENT_CLINIC_OR_DEPARTMENT_OTHER): Payer: Medicare Other | Admitting: Anesthesiology

## 2023-03-24 DIAGNOSIS — Z87891 Personal history of nicotine dependence: Secondary | ICD-10-CM | POA: Insufficient documentation

## 2023-03-24 DIAGNOSIS — M179 Osteoarthritis of knee, unspecified: Principal | ICD-10-CM | POA: Diagnosis present

## 2023-03-24 DIAGNOSIS — M1711 Unilateral primary osteoarthritis, right knee: Principal | ICD-10-CM | POA: Diagnosis present

## 2023-03-24 DIAGNOSIS — G8918 Other acute postprocedural pain: Secondary | ICD-10-CM | POA: Diagnosis not present

## 2023-03-24 HISTORY — PX: TOTAL KNEE ARTHROPLASTY: SHX125

## 2023-03-24 SURGERY — ARTHROPLASTY, KNEE, TOTAL
Anesthesia: Spinal | Site: Knee | Laterality: Right

## 2023-03-24 MED ORDER — FENTANYL CITRATE PF 50 MCG/ML IJ SOSY
PREFILLED_SYRINGE | INTRAMUSCULAR | Status: AC
  Filled 2023-03-24: qty 1

## 2023-03-24 MED ORDER — BISACODYL 10 MG RE SUPP: 10.0000 mg | Freq: Every day | RECTAL | Status: DC | PRN

## 2023-03-24 MED ORDER — SODIUM CHLORIDE (PF) 0.9 % IJ SOLN
INTRAMUSCULAR | Status: DC | PRN
  Administered 2023-03-24: 60 mL

## 2023-03-24 MED ORDER — BUPIVACAINE LIPOSOME 1.3 % IJ SUSP: 20.0000 mL | Freq: Once | INTRAMUSCULAR | Status: AC

## 2023-03-24 MED ORDER — CLONAZEPAM 1 MG PO TABS
2.0000 mg | ORAL_TABLET | Freq: Every evening | ORAL | Status: DC | PRN
  Administered 2023-03-24: 2 mg via ORAL
  Filled 2023-03-24: qty 2

## 2023-03-24 MED ORDER — ASPIRIN 81 MG PO CHEW
81.0000 mg | CHEWABLE_TABLET | Freq: Two times a day (BID) | ORAL | Status: DC
  Administered 2023-03-25: 81 mg via ORAL
  Filled 2023-03-24: qty 1

## 2023-03-24 MED ORDER — AMITRIPTYLINE HCL 50 MG PO TABS
75.0000 mg | ORAL_TABLET | Freq: Every day | ORAL | Status: DC
  Administered 2023-03-24: 75 mg via ORAL
  Filled 2023-03-24: qty 1.5

## 2023-03-24 MED ORDER — DIPHENHYDRAMINE HCL 12.5 MG/5ML PO ELIX: 12.5000 mg | ORAL_SOLUTION | ORAL | Status: DC | PRN

## 2023-03-24 MED ORDER — LACTATED RINGERS IV SOLN: INTRAVENOUS | Status: DC

## 2023-03-24 MED ORDER — BUPIVACAINE LIPOSOME 1.3 % IJ SUSP
INTRAMUSCULAR | Status: DC | PRN
  Administered 2023-03-24: 20 mL

## 2023-03-24 MED ORDER — SODIUM CHLORIDE 0.9 % IR SOLN
Status: DC | PRN
  Administered 2023-03-24: 1000 mL

## 2023-03-24 MED ORDER — EZETIMIBE 10 MG PO TABS: 10.0000 mg | ORAL_TABLET | Freq: Every day | ORAL | Status: DC

## 2023-03-24 MED ORDER — 0.9 % SODIUM CHLORIDE (POUR BTL) OPTIME
TOPICAL | Status: DC | PRN
  Administered 2023-03-24: 1000 mL

## 2023-03-24 MED ORDER — ROPIVACAINE HCL 7.5 MG/ML IJ SOLN
INTRAMUSCULAR | Status: DC | PRN
  Administered 2023-03-24: 20 mL via PERINEURAL

## 2023-03-24 MED ORDER — MENTHOL 3 MG MT LOZG: 1.0000 | LOZENGE | OROMUCOSAL | Status: DC | PRN

## 2023-03-24 MED ORDER — MIDAZOLAM HCL 5 MG/5ML IJ SOLN
INTRAMUSCULAR | Status: DC | PRN
  Administered 2023-03-24: 2 mg via INTRAVENOUS

## 2023-03-24 MED ORDER — OXYCODONE HCL 5 MG PO TABS: 5.0000 mg | ORAL_TABLET | Freq: Once | ORAL | Status: DC | PRN

## 2023-03-24 MED ORDER — OXYCODONE HCL 5 MG/5ML PO SOLN: 5.0000 mg | Freq: Once | ORAL | Status: DC | PRN

## 2023-03-24 MED ORDER — BUPIVACAINE LIPOSOME 1.3 % IJ SUSP
INTRAMUSCULAR | Status: AC
  Filled 2023-03-24: qty 20

## 2023-03-24 MED ORDER — HYDROMORPHONE HCL 1 MG/ML IJ SOLN
0.5000 mg | INTRAMUSCULAR | Status: DC | PRN
  Administered 2023-03-24: 1 mg via INTRAVENOUS
  Filled 2023-03-24: qty 1

## 2023-03-24 MED ORDER — ACETAMINOPHEN 10 MG/ML IV SOLN
1000.0000 mg | Freq: Four times a day (QID) | INTRAVENOUS | Status: DC
  Administered 2023-03-24: 1000 mg via INTRAVENOUS
  Filled 2023-03-24: qty 100

## 2023-03-24 MED ORDER — POVIDONE-IODINE 10 % EX SWAB: 2.0000 | Freq: Once | CUTANEOUS | Status: AC

## 2023-03-24 MED ORDER — SERTRALINE HCL 50 MG PO TABS
50.0000 mg | ORAL_TABLET | Freq: Every day | ORAL | Status: DC
  Administered 2023-03-24: 50 mg via ORAL
  Filled 2023-03-24: qty 1

## 2023-03-24 MED ORDER — ORAL CARE MOUTH RINSE: 15.0000 mL | OROMUCOSAL | Status: DC | PRN

## 2023-03-24 MED ORDER — GABAPENTIN 300 MG PO CAPS
300.0000 mg | ORAL_CAPSULE | Freq: Every day | ORAL | Status: DC
  Administered 2023-03-24: 300 mg via ORAL
  Filled 2023-03-24: qty 1

## 2023-03-24 MED ORDER — SODIUM CHLORIDE 0.9 % IV SOLN: INTRAVENOUS | Status: DC

## 2023-03-24 MED ORDER — PROPOFOL 500 MG/50ML IV EMUL
INTRAVENOUS | Status: DC | PRN
  Administered 2023-03-24: 100 ug/kg/min via INTRAVENOUS

## 2023-03-24 MED ORDER — METHOCARBAMOL 500 MG PO TABS
500.0000 mg | ORAL_TABLET | Freq: Four times a day (QID) | ORAL | Status: DC | PRN
  Administered 2023-03-24 – 2023-03-25 (×3): 500 mg via ORAL
  Filled 2023-03-24 (×3): qty 1

## 2023-03-24 MED ORDER — FAMOTIDINE 20 MG PO TABS: 20.0000 mg | ORAL_TABLET | Freq: Every day | ORAL | Status: DC | PRN

## 2023-03-24 MED ORDER — HYDROMORPHONE HCL 2 MG PO TABS
2.0000 mg | ORAL_TABLET | ORAL | Status: DC | PRN
  Administered 2023-03-24 – 2023-03-25 (×5): 4 mg via ORAL
  Filled 2023-03-24 (×3): qty 2
  Filled 2023-03-24: qty 1
  Filled 2023-03-24 (×2): qty 2

## 2023-03-24 MED ORDER — CEFAZOLIN SODIUM-DEXTROSE 2-4 GM/100ML-% IV SOLN
2.0000 g | INTRAVENOUS | Status: AC
  Administered 2023-03-24: 2 g via INTRAVENOUS
  Filled 2023-03-24: qty 100

## 2023-03-24 MED ORDER — PROMETHAZINE HCL 12.5 MG PO TABS: 6.2500 mg | ORAL_TABLET | Freq: Four times a day (QID) | ORAL | Status: DC | PRN

## 2023-03-24 MED ORDER — HYDROMORPHONE HCL 2 MG PO TABS
1.0000 mg | ORAL_TABLET | ORAL | Status: DC | PRN
  Administered 2023-03-24 – 2023-03-25 (×2): 2 mg via ORAL
  Filled 2023-03-24: qty 1

## 2023-03-24 MED ORDER — FLEET ENEMA 7-19 GM/118ML RE ENEM: 1.0000 | ENEMA | Freq: Once | RECTAL | Status: DC | PRN

## 2023-03-24 MED ORDER — ACETAMINOPHEN 500 MG PO TABS
1000.0000 mg | ORAL_TABLET | Freq: Four times a day (QID) | ORAL | Status: AC
  Administered 2023-03-24 – 2023-03-25 (×3): 1000 mg via ORAL
  Filled 2023-03-24 (×3): qty 2

## 2023-03-24 MED ORDER — POTASSIUM 99 MG PO TABS: 99.0000 mg | ORAL_TABLET | Freq: Every day | ORAL | Status: DC

## 2023-03-24 MED ORDER — ONDANSETRON HCL 4 MG/2ML IJ SOLN
INTRAMUSCULAR | Status: DC | PRN
  Administered 2023-03-24: 4 mg via INTRAVENOUS

## 2023-03-24 MED ORDER — HYDROMORPHONE HCL 1 MG/ML IJ SOLN: 0.2500 mg | INTRAMUSCULAR | Status: DC | PRN

## 2023-03-24 MED ORDER — SODIUM CHLORIDE (PF) 0.9 % IJ SOLN
INTRAMUSCULAR | Status: AC
  Filled 2023-03-24: qty 50

## 2023-03-24 MED ORDER — DOCUSATE SODIUM 100 MG PO CAPS
100.0000 mg | ORAL_CAPSULE | Freq: Two times a day (BID) | ORAL | Status: DC
  Administered 2023-03-24 – 2023-03-25 (×2): 100 mg via ORAL
  Filled 2023-03-24 (×2): qty 1

## 2023-03-24 MED ORDER — ONDANSETRON HCL 4 MG PO TABS
4.0000 mg | ORAL_TABLET | Freq: Four times a day (QID) | ORAL | Status: DC | PRN
  Administered 2023-03-24 – 2023-03-25 (×2): 4 mg via ORAL
  Filled 2023-03-24 (×2): qty 1

## 2023-03-24 MED ORDER — PHENOL 1.4 % MT LIQD: 1.0000 | OROMUCOSAL | Status: DC | PRN

## 2023-03-24 MED ORDER — PROMETHAZINE (PHENERGAN) 6.25MG IN NS 50ML IVPB
6.2500 mg | Freq: Four times a day (QID) | INTRAVENOUS | Status: DC | PRN
  Administered 2023-03-24: 12.5 mg via INTRAVENOUS
  Administered 2023-03-25: 6.25 mg via INTRAVENOUS
  Filled 2023-03-24: qty 6.25
  Filled 2023-03-24: qty 12.5

## 2023-03-24 MED ORDER — DEXAMETHASONE SODIUM PHOSPHATE 4 MG/ML IJ SOLN
INTRAMUSCULAR | Status: DC | PRN
  Administered 2023-03-24: 8 mg via INTRAVENOUS

## 2023-03-24 MED ORDER — STERILE WATER FOR IRRIGATION IR SOLN
Status: DC | PRN
  Administered 2023-03-24: 2000 mL

## 2023-03-24 MED ORDER — TRANEXAMIC ACID-NACL 1000-0.7 MG/100ML-% IV SOLN
1000.0000 mg | INTRAVENOUS | Status: AC
  Administered 2023-03-24: 1000 mg via INTRAVENOUS
  Filled 2023-03-24: qty 100

## 2023-03-24 MED ORDER — METOCLOPRAMIDE HCL 5 MG/ML IJ SOLN
5.0000 mg | Freq: Three times a day (TID) | INTRAMUSCULAR | Status: DC | PRN
  Administered 2023-03-24: 10 mg via INTRAVENOUS
  Filled 2023-03-24: qty 2

## 2023-03-24 MED ORDER — DEXAMETHASONE SODIUM PHOSPHATE 10 MG/ML IJ SOLN
10.0000 mg | Freq: Once | INTRAMUSCULAR | Status: AC
  Administered 2023-03-25: 10 mg via INTRAVENOUS
  Filled 2023-03-24: qty 1

## 2023-03-24 MED ORDER — MIDAZOLAM HCL 2 MG/2ML IJ SOLN
INTRAMUSCULAR | Status: AC
  Filled 2023-03-24: qty 2

## 2023-03-24 MED ORDER — ONDANSETRON HCL 4 MG/2ML IJ SOLN: 4.0000 mg | Freq: Once | INTRAMUSCULAR | Status: DC | PRN

## 2023-03-24 MED ORDER — BUPIVACAINE IN DEXTROSE 0.75-8.25 % IT SOLN
INTRATHECAL | Status: DC | PRN
  Administered 2023-03-24: 1.6 mL via INTRATHECAL

## 2023-03-24 MED ORDER — DEXAMETHASONE SODIUM PHOSPHATE 10 MG/ML IJ SOLN: 8.0000 mg | Freq: Once | INTRAMUSCULAR | Status: AC

## 2023-03-24 MED ORDER — ONDANSETRON HCL 4 MG/2ML IJ SOLN
4.0000 mg | Freq: Four times a day (QID) | INTRAMUSCULAR | Status: DC | PRN
  Administered 2023-03-24: 4 mg via INTRAVENOUS
  Filled 2023-03-24: qty 2

## 2023-03-24 MED ORDER — SODIUM CHLORIDE (PF) 0.9 % IJ SOLN
INTRAMUSCULAR | Status: AC
  Filled 2023-03-24: qty 10

## 2023-03-24 MED ORDER — ACETAMINOPHEN 10 MG/ML IV SOLN: 1000.0000 mg | Freq: Once | INTRAVENOUS | Status: DC | PRN

## 2023-03-24 MED ORDER — ORAL CARE MOUTH RINSE: 15.0000 mL | Freq: Once | OROMUCOSAL | Status: AC

## 2023-03-24 MED ORDER — CHLORHEXIDINE GLUCONATE 0.12 % MT SOLN
15.0000 mL | Freq: Once | OROMUCOSAL | Status: AC
  Administered 2023-03-24: 15 mL via OROMUCOSAL

## 2023-03-24 MED ORDER — POLYETHYLENE GLYCOL 3350 17 G PO PACK: 17.0000 g | PACK | Freq: Every day | ORAL | Status: DC | PRN

## 2023-03-24 MED ORDER — CEFAZOLIN SODIUM-DEXTROSE 2-4 GM/100ML-% IV SOLN
2.0000 g | Freq: Four times a day (QID) | INTRAVENOUS | Status: AC
  Administered 2023-03-24 (×2): 2 g via INTRAVENOUS
  Filled 2023-03-24 (×2): qty 100

## 2023-03-24 MED ORDER — METOCLOPRAMIDE HCL 5 MG PO TABS: 5.0000 mg | ORAL_TABLET | Freq: Three times a day (TID) | ORAL | Status: DC | PRN

## 2023-03-24 MED ORDER — TOPIRAMATE 25 MG PO TABS
50.0000 mg | ORAL_TABLET | Freq: Every day | ORAL | Status: DC
  Administered 2023-03-24: 50 mg via ORAL
  Filled 2023-03-24: qty 2

## 2023-03-24 MED ORDER — METHOCARBAMOL 1000 MG/10ML IJ SOLN: 500.0000 mg | Freq: Four times a day (QID) | INTRAVENOUS | Status: DC | PRN

## 2023-03-24 MED ORDER — FENTANYL CITRATE (PF) 100 MCG/2ML IJ SOLN
INTRAMUSCULAR | Status: DC | PRN
  Administered 2023-03-24: 50 ug via INTRAVENOUS

## 2023-03-24 SURGICAL SUPPLY — 56 items
ADH SKN CLS APL DERMABOND .7 (GAUZE/BANDAGES/DRESSINGS) ×1
ATTUNE PSFEM RTSZ6 NARCEM KNEE (Femur) IMPLANT
ATTUNE PSRP INSR SZ6 8 KNEE (Insert) IMPLANT
BAG COUNTER SPONGE SURGICOUNT (BAG) IMPLANT
BAG SPEC THK2 15X12 ZIP CLS (MISCELLANEOUS) ×1
BAG SPNG CNTER NS LX DISP (BAG)
BAG ZIPLOCK 12X15 (MISCELLANEOUS) ×1 IMPLANT
BASE TIBIAL ROT PLAT SZ 5 KNEE (Knees) IMPLANT
BLADE SAG 18X100X1.27 (BLADE) ×1 IMPLANT
BLADE SAW SGTL 11.0X1.19X90.0M (BLADE) ×1 IMPLANT
BNDG CMPR 6 X 5 YARDS HK CLSR (GAUZE/BANDAGES/DRESSINGS) ×1
BNDG ELASTIC 6INX 5YD STR LF (GAUZE/BANDAGES/DRESSINGS) ×1 IMPLANT
BOWL SMART MIX CTS (DISPOSABLE) ×1 IMPLANT
BSPLAT TIB 5 CMNT ROT PLAT STR (Knees) ×1 IMPLANT
CEMENT HV SMART SET (Cement) ×2 IMPLANT
COVER SURGICAL LIGHT HANDLE (MISCELLANEOUS) ×1 IMPLANT
CUFF TOURN SGL QUICK 34 (TOURNIQUET CUFF) ×1
CUFF TRNQT CYL 34X4.125X (TOURNIQUET CUFF) ×1 IMPLANT
DERMABOND ADVANCED .7 DNX12 (GAUZE/BANDAGES/DRESSINGS) ×1 IMPLANT
DRAPE INCISE IOBAN 66X45 STRL (DRAPES) ×1 IMPLANT
DRAPE U-SHAPE 47X51 STRL (DRAPES) ×1 IMPLANT
DRSG AQUACEL AG ADV 3.5X10 (GAUZE/BANDAGES/DRESSINGS) ×1 IMPLANT
DURAPREP 26ML APPLICATOR (WOUND CARE) ×1 IMPLANT
ELECT REM PT RETURN 15FT ADLT (MISCELLANEOUS) ×1 IMPLANT
GLOVE BIO SURGEON STRL SZ 6.5 (GLOVE) IMPLANT
GLOVE BIO SURGEON STRL SZ8 (GLOVE) ×1 IMPLANT
GLOVE BIOGEL PI IND STRL 6.5 (GLOVE) IMPLANT
GLOVE BIOGEL PI IND STRL 7.0 (GLOVE) IMPLANT
GLOVE BIOGEL PI IND STRL 8 (GLOVE) ×1 IMPLANT
GOWN STRL REUS W/ TWL LRG LVL3 (GOWN DISPOSABLE) ×1 IMPLANT
GOWN STRL REUS W/TWL LRG LVL3 (GOWN DISPOSABLE) ×1
HANDPIECE INTERPULSE COAX TIP (DISPOSABLE) ×1
HOLDER FOLEY CATH W/STRAP (MISCELLANEOUS) IMPLANT
IMMOBILIZER KNEE 20 (SOFTGOODS) ×1
IMMOBILIZER KNEE 20 THIGH 36 (SOFTGOODS) ×1 IMPLANT
KIT TURNOVER KIT A (KITS) IMPLANT
MANIFOLD NEPTUNE II (INSTRUMENTS) ×1 IMPLANT
NS IRRIG 1000ML POUR BTL (IV SOLUTION) ×1 IMPLANT
PACK TOTAL KNEE CUSTOM (KITS) ×1 IMPLANT
PADDING CAST COTTON 6X4 STRL (CAST SUPPLIES) ×2 IMPLANT
PATELLA MEDIAL ATTUN 35MM KNEE (Knees) IMPLANT
PIN STEINMAN FIXATION KNEE (PIN) IMPLANT
PROTECTOR NERVE ULNAR (MISCELLANEOUS) ×1 IMPLANT
SET HNDPC FAN SPRY TIP SCT (DISPOSABLE) ×1 IMPLANT
SPIKE FLUID TRANSFER (MISCELLANEOUS) ×1 IMPLANT
SUT MNCRL AB 4-0 PS2 18 (SUTURE) ×1 IMPLANT
SUT STRATAFIX 0 PDS 27 VIOLET (SUTURE) ×1
SUT VIC AB 2-0 CT1 27 (SUTURE) ×3
SUT VIC AB 2-0 CT1 TAPERPNT 27 (SUTURE) ×3 IMPLANT
SUT VLOC 90 6 CV-15 VIOLET (SUTURE) IMPLANT
SUTURE STRATFX 0 PDS 27 VIOLET (SUTURE) ×1 IMPLANT
TIBIAL BASE ROT PLAT SZ 5 KNEE (Knees) ×1 IMPLANT
TRAY FOLEY MTR SLVR 16FR STAT (SET/KITS/TRAYS/PACK) ×1 IMPLANT
TUBE SUCTION HIGH CAP CLEAR NV (SUCTIONS) ×1 IMPLANT
WATER STERILE IRR 1000ML POUR (IV SOLUTION) ×2 IMPLANT
WRAP KNEE MAXI GEL POST OP (GAUZE/BANDAGES/DRESSINGS) ×1 IMPLANT

## 2023-03-24 NOTE — Anesthesia Procedure Notes (Signed)
Spinal  Patient location during procedure: OR Start time: 03/24/2023 7:14 AM End time: 03/24/2023 7:18 AM Reason for block: surgical anesthesia Staffing Performed: anesthesiologist  Anesthesiologist: Eilene Ghazi, MD Performed by: Eilene Ghazi, MD Authorized by: Eilene Ghazi, MD   Preanesthetic Checklist Completed: patient identified, IV checked, site marked, risks and benefits discussed, surgical consent, monitors and equipment checked, pre-op evaluation and timeout performed Spinal Block Patient position: sitting Prep: Betadine Patient monitoring: heart rate, continuous pulse ox and blood pressure Approach: midline Location: L3-4 Injection technique: single-shot Needle Needle type: Sprotte  Needle gauge: 24 G Needle length: 9 cm Assessment Sensory level: T6 Events: CSF return Additional Notes

## 2023-03-24 NOTE — Anesthesia Preprocedure Evaluation (Signed)
Anesthesia Evaluation  Patient identified by MRN, date of birth, ID band Patient awake    Reviewed: Allergy & Precautions, H&P , NPO status , Patient's Chart, lab work & pertinent test results  History of Anesthesia Complications (+) PONV and history of anesthetic complications  Airway Mallampati: II  TM Distance: >3 FB Neck ROM: Full    Dental no notable dental hx.    Pulmonary neg pulmonary ROS, former smoker   Pulmonary exam normal breath sounds clear to auscultation       Cardiovascular negative cardio ROS Normal cardiovascular exam Rhythm:Regular Rate:Normal     Neuro/Psych negative neurological ROS  negative psych ROS   GI/Hepatic Neg liver ROS,GERD  ,,  Endo/Other  negative endocrine ROS    Renal/GU negative Renal ROS  negative genitourinary   Musculoskeletal  (+) Arthritis , Osteoarthritis and Rheumatoid disorders,    Abdominal   Peds negative pediatric ROS (+)  Hematology negative hematology ROS (+)   Anesthesia Other Findings   Reproductive/Obstetrics negative OB ROS                             Anesthesia Physical Anesthesia Plan  ASA: 2  Anesthesia Plan: Spinal   Post-op Pain Management: Regional block*   Induction: Intravenous  PONV Risk Score and Plan: 3 and Ondansetron, Dexamethasone, Propofol infusion and Treatment may vary due to age or medical condition  Airway Management Planned: Simple Face Mask  Additional Equipment:   Intra-op Plan:   Post-operative Plan:   Informed Consent: I have reviewed the patients History and Physical, chart, labs and discussed the procedure including the risks, benefits and alternatives for the proposed anesthesia with the patient or authorized representative who has indicated his/her understanding and acceptance.     Dental advisory given  Plan Discussed with: CRNA and Surgeon  Anesthesia Plan Comments:         Anesthesia Quick Evaluation

## 2023-03-24 NOTE — Progress Notes (Signed)
After ambulating w/ therapy and assisted to chair, patient c/o severe pain to knee. IV dilaudid administered at 1610. At 1630 patient called out, reported feeling hot and nauseated. Assessed patient, noted patient to be pale, diaphoretic but alert and oriented. BP 166/88 P 79. Administered zofran for nausea. 1645 BP 164/81 P 77. Patient expressed concern about high BP. Educated possible reasons for elevation and POC for treatment, ie. Pain medication effect, repositioned in chair, cool washcloth, encouraged deep breaths and relaxation and plan to recheck BP at 1700. Patient and family verbalized agreement. 1700 BP 158/83 P 73. Patient appears more at ease though still mildly nauseated. Will cont to monitor. Educated patient to notifiy staff of any changes in comfort or feelings of wellness.

## 2023-03-24 NOTE — Progress Notes (Signed)
Orthopedic Tech Progress Note Patient Details:  [name] 1951/07/18 [id number]  CPM Right Knee CPM Right Knee: On Right Knee Flexion (Degrees): 40 Right Knee Extension (Degrees): 10  Post Interventions Patient Tolerated: Well  Darleen Crocker 03/24/2023, 9:30 AM

## 2023-03-24 NOTE — Op Note (Signed)
OPERATIVE REPORT-TOTAL KNEE ARTHROPLASTY   Pre-operative diagnosis- Osteoarthritis  Right knee(s)  Post-operative diagnosis- Osteoarthritis Right knee(s)  Procedure-  Right  Total Knee Arthroplasty  Surgeon- Gus Rankin. Jermiyah Ricotta, MD  Assistant- Rene Paci, PA-C   Anesthesia-   Adductor canal block and spinal  EBL-50 mL   Drains None  Tourniquet time- 33 minutes @ 300 mm Hg  Complications- None  Condition-PACU - hemodynamically stable.   Brief Clinical Note  Wendy [name] is a 72 y.o. year old female with end stage OA of her right knee with progressively worsening pain and dysfunction. She has constant pain, with activity and at rest and significant functional deficits with difficulties even with ADLs. She has had extensive non-op management including analgesics, injections of cortisone and viscosupplements, and home exercise program, but remains in significant pain with significant dysfunction.Radiographs show bone on bone arthritis medial and patellofemoral. She presents now for right Total Knee Arthroplasty.     Procedure in detail---   The patient is brought into the operating room and positioned supine on the operating table. After successful administration of  Adductor canal block and spinal,   a tourniquet is placed high on the  Right thigh(s) and the lower extremity is prepped and draped in the usual sterile fashion. Time out is performed by the operating team and then the  Right lower extremity is wrapped in Esmarch, knee flexed and the tourniquet inflated to 300 mmHg.       A midline incision is made with a ten blade through the subcutaneous tissue to the level of the extensor mechanism. A fresh blade is used to make a medial parapatellar arthrotomy. Soft tissue over the proximal medial tibia is subperiosteally elevated to the joint line with a knife and into the semimembranosus bursa with a Cobb elevator. Soft tissue over the proximal lateral tibia is elevated with attention  being paid to avoiding the patellar tendon on the tibial tubercle. The patella is everted, knee flexed 90 degrees and the ACL and PCL are removed. Findings are bone on bone medial and patellofemoral with large global osteophytes.        The drill is used to create a starting hole in the distal femur and the canal is thoroughly irrigated with sterile saline to remove the fatty contents. The 5 degree Right  valgus alignment guide is placed into the femoral canal and the distal femoral cutting block is pinned to remove 9 mm off the distal femur. Resection is made with an oscillating saw.      The tibia is subluxed forward and the menisci are removed. The extramedullary alignment guide is placed referencing proximally at the medial aspect of the tibial tubercle and distally along the second metatarsal axis and tibial crest. The block is pinned to remove 2mm off the more deficient medial  side. Resection is made with an oscillating saw. Size 5is the most appropriate size for the tibia and the proximal tibia is prepared with the modular drill and keel punch for that size.      The femoral sizing guide is placed and size 6 is most appropriate. Rotation is marked off the epicondylar axis and confirmed by creating a rectangular flexion gap at 90 degrees. The size 6 cutting block is pinned in this rotation and the anterior, posterior and chamfer cuts are made with the oscillating saw. The intercondylar block is then placed and that cut is made.      Trial size 5 tibial component, trial size 6  narrow posterior stabilized femur and a 8  mm posterior stabilized rotating platform insert trial is placed. Full extension is achieved with excellent varus/valgus and anterior/posterior balance throughout full range of motion. The patella is everted and thickness measured to be 22  mm. Free hand resection is taken to 12 mm, a 35 template is placed, lug holes are drilled, trial patella is placed, and it tracks normally. Osteophytes  are removed off the posterior femur with the trial in place. All trials are removed and the cut bone surfaces prepared with pulsatile lavage. Cement is mixed and once ready for implantation, the size 5 tibial implant, size  6 narrow posterior stabilized femoral component, and the size 35 patella are cemented in place and the patella is held with the clamp. The trial insert is placed and the knee held in full extension. The Exparel (20 ml mixed with 60 ml saline) is injected into the extensor mechanism, posterior capsule, medial and lateral gutters and subcutaneous tissues.  All extruded cement is removed and once the cement is hard the permanent 8 mm posterior stabilized rotating platform insert is placed into the tibial tray.      The wound is copiously irrigated with saline solution and the extensor mechanism closed with # 0 Stratofix suture. The tourniquet is released for a total tourniquet time of 33  minutes. Flexion against gravity is 140 degrees and the patella tracks normally. Subcutaneous tissue is closed with 2.0 vicryl and subcuticular with running 4.0 Monocryl. The incision is cleaned and dried and steri-strips and a bulky sterile dressing are applied. The limb is placed into a knee immobilizer and the patient is awakened and transported to recovery in stable condition.      Please note that a surgical assistant was a medical necessity for this procedure in order to perform it in a safe and expeditious manner. Surgical assistant was necessary to retract the ligaments and vital neurovascular structures to prevent injury to them and also necessary for proper positioning of the limb to allow for anatomic placement of the prosthesis.   Gus Rankin Eivin Mascio, MD    03/24/2023, 8:17 AM

## 2023-03-24 NOTE — Anesthesia Procedure Notes (Signed)
Anesthesia Regional Block: Adductor canal block   Pre-Anesthetic Checklist: , timeout performed,  Correct Patient, Correct Site, Correct Laterality,  Correct Procedure, Correct Position, site marked,  Risks and benefits discussed,  Surgical consent,  Pre-op evaluation,  At surgeon's request and post-op pain management  Laterality: Right  Prep: chloraprep       Needles:  Injection technique: Single-shot  Needle Type: Echogenic Needle     Needle Length: 9cm      Additional Needles:   Procedures:,,,, ultrasound used (permanent image in chart),,    Narrative:  Start time: 03/24/2023 6:52 AM End time: 03/24/2023 7:00 AM Injection made incrementally with aspirations every 5 mL.  Performed by: Personally  Anesthesiologist: Eilene Ghazi, MD  Additional Notes: Patient tolerated the procedure well without complications

## 2023-03-24 NOTE — Anesthesia Postprocedure Evaluation (Signed)
Anesthesia Post Note  Patient: Wendy [name]  Procedure(s) Performed: RIGHT TOTAL KNEE ARTHROPLASTY (Right: Knee)     Patient location during evaluation: PACU Anesthesia Type: Spinal Level of consciousness: oriented and awake and alert Pain management: pain level controlled Vital Signs Assessment: post-procedure vital signs reviewed and stable Respiratory status: spontaneous breathing, respiratory function stable and patient connected to nasal cannula oxygen Cardiovascular status: blood pressure returned to baseline and stable Postop Assessment: no headache, no backache and no apparent nausea or vomiting Anesthetic complications: no  No notable events documented.  Last Vitals:  Vitals:   03/24/23 0915 03/24/23 0930  BP: 137/73 136/77  Pulse: 66 73  Resp: 13 17  Temp:    SpO2: 98% 100%    Last Pain:  Vitals:   03/24/23 0930  TempSrc:   PainSc: 0-No pain    LLE Motor Response: Purposeful movement (03/24/23 0930) LLE Sensation: Decreased (03/24/23 0930) RLE Motor Response: Purposeful movement (03/24/23 0930) RLE Sensation: Decreased (03/24/23 0930) L Sensory Level: S2-Posterior medial thigh and lower leg (03/24/23 0930) R Sensory Level: S2-Posterior medial thigh and lower leg (03/24/23 0930)  Psalm Schappell S

## 2023-03-24 NOTE — Anesthesia Procedure Notes (Signed)
Anesthesia Procedure Image    

## 2023-03-24 NOTE — Care Plan (Signed)
Ortho Bundle Case Management Note  Patient Details  Name: Wendy [name] MRN: [id number] Date of Birth: [birth year]                  R TKA on 03/24/23.  DCP: Home with daughter, who will be in town for 9 days. DME: RW ordered through [facility].  PT: [facility] 7/26. Referral faxed.   DME Arranged:  [facility] D[facility] Agency:       Additional Comments: Please contact me with any questions of if this plan should need to change.    Despina Pole, Case Manager  [facility]  [phone] 03/24/2023, 8:25 AM

## 2023-03-24 NOTE — Progress Notes (Signed)
Orthopedic Tech Progress Note Patient Details:  Wendy [name] 09-Sep-1950 [id number]  CPM Right Knee CPM Right Knee: Off Right Knee Flexion (Degrees): 40 Right Knee Extension (Degrees): 10  Post Interventions Patient Tolerated: Well  Darleen Crocker 03/24/2023, 1:18 PM

## 2023-03-24 NOTE — Evaluation (Signed)
Physical Therapy Evaluation Patient Details Name: Wendy [name] MRN: [id number] DOB: [birth year] Today's Date: 03/25/2023  History of Present Illness  72 yo female presents to therapy s/p R TKA on 03/24/2023 due to failure of conservative measures. Pt PMH includes but is not limited to: R foot fx, anemia, anxiety, fibromyalgia, HLD, RLS, and RA.  Clinical Impression    Wendy [name] is a 72 y.o. female POD 0 s/p R TKA. Patient reports IND with mobility at baseline. Patient is now limited by functional impairments (see PT problem list below) and requires min guard for bed mobility and min guard for transfers. Patient was able to ambulate 20 feet with RW and min guard level of assist. Patient instructed in exercise to facilitate ROM and circulation to manage edema. Pt limited due to pain at time of evaluation. Pt and family ed provided on use of CP and requesting pain medication, all parties demonstrated verbal understanding. Patient will benefit from continued skilled PT interventions to address impairments and progress towards PLOF. Acute PT will follow to progress mobility and stair training in preparation for safe discharge home with family assist and OPPT services.       Assistance Recommended at Discharge Intermittent Supervision/Assistance  If plan is discharge home, recommend the following:  Can travel by private vehicle  A little help with walking and/or transfers;A little help with bathing/dressing/bathroom;Assistance with cooking/housework;Assist for transportation;Help with stairs or ramp for entrance        Equipment Recommendations Rolling walker (2 wheels)  Recommendations for Other Services       Functional Status Assessment Patient has had a recent decline in their functional status and demonstrates the ability to make significant improvements in function in a reasonable and predictable amount of time.     Precautions / Restrictions Precautions Precautions:  Fall;Knee Restrictions Weight Bearing Restrictions: No      Mobility  Bed Mobility Overal bed mobility: Modified Independent Bed Mobility: Supine to Sit, Sit to Supine     Supine to sit: HOB elevated, Supervision Sit to supine: Supervision   General bed mobility comments: increased time and HOB elevated    Transfers Overall transfer level: Needs assistance Equipment used: Rolling walker (2 wheels) Transfers: Sit to/from Stand Sit to Stand: Supervision           General transfer comment: cues for proper UE placement push to stand with R UE, minimal R LE WB with transition, bed and commode transfer    Ambulation/Gait Ambulation/Gait assistance: Min guard Gait Distance (Feet): 100 Feet Assistive device: Rolling walker (2 wheels) Gait Pattern/deviations: Step-to pattern, Decreased stance time - right, Antalgic Gait velocity: decreased     General Gait Details: heavy reliance on B UE support at RW, short choppy steps with cues for use of UE support to control impact on L foot with improved return demonstration.  Stairs Stairs: Yes Stairs assistance: Min guard Stair Management: No rails, With walker Number of Stairs: 1 General stair comments: 1 curb x 2 trials with cues for proper sequencing and technique  Wheelchair Mobility     Tilt Bed    Modified Rankin (Stroke Patients Only)       Balance Overall balance assessment: Needs assistance, History of Falls (R knee instability and tripps over dogs, slid in bathtub) Sitting-balance support: Feet supported Sitting balance-Leahy Scale: Good     Standing balance support: Bilateral upper extremity supported, During functional activity, Reliant on assistive device for balance Standing balance-Leahy Scale: Fair Standing balance comment: pt  demonstrated LOB without UE support with ADLs at sink and able to implement self recovery stratagies pt continues to self limit R LE WB and cues for placing whole R foot on floor  vs toe touch                             Pertinent Vitals/Pain Pain Assessment Pain Assessment: 0-10 Pain Score: 7  Pain Location: R hip to ankle Pain Descriptors / Indicators: Aching, Constant, Discomfort, Operative site guarding Pain Intervention(s): Limited activity within patient's tolerance, Monitored during session, Premedicated before session, Repositioned, Ice applied (pain medication ~ prior to tx session)    Home Living Family/patient expects to be discharged to:: Private residence Living Arrangements: Spouse/significant other;Parent Available Help at Discharge: Family Type of Home: House Home Access: Stairs to enter Entrance Stairs-Rails: None Entrance Stairs-Number of Steps: 1 Alternate Level Stairs-Number of Steps: stair lift to access Home Layout: Two level;Able to live on main level with bedroom/bathroom Home Equipment: Tub bench;Cane - single point;Rollator (4 wheels)      Prior Function Prior Level of Function : Independent/Modified Independent;Driving             Mobility Comments: IND without AD for all ADLs. self care tasks, driving ADLs Comments: bowling     Hand Dominance        Extremity/Trunk Assessment             Cervical / Trunk Assessment Cervical / Trunk Assessment: Normal  Communication   Communication: No difficulties  Cognition Arousal/Alertness: Awake/alert Behavior During Therapy: WFL for tasks assessed/performed Overall Cognitive Status: Within Functional Limits for tasks assessed                                          General Comments      Exercises Total Joint Exercises Ankle Circles/Pumps: AROM, Both, 10 reps Quad Sets: AROM, Right, 5 reps (tactile cues) Short Arc Quad: AAROM, Right, 5 reps Heel Slides: AROM, Right, 5 reps (limited range) Hip ABduction/ADduction: AROM, Right, 5 reps Straight Leg Raises: AROM, Right, 5 reps Knee Flexion: AROM, Right, 5 reps, Seated (pt  compensating but offloading R hip/buttocks in sitting)   Assessment/Plan    PT Assessment Patient needs continued PT services  PT Problem List Decreased strength;Decreased range of motion;Decreased activity tolerance;Decreased balance;Decreased mobility;Decreased coordination;Pain       PT Treatment Interventions DME instruction;Gait training;Stair training;Functional mobility training;Therapeutic activities;Therapeutic exercise;Balance training;Neuromuscular re-education;Patient/family education;Modalities    PT Goals (Current goals can be found in the Care Plan section)  Acute Rehab PT Goals Patient Stated Goal: to be able to fly to [place] in 8 wks PT Goal Formulation: With patient/family Time For Goal Achievement: 04/14/23 Potential to Achieve Goals: Good    Frequency 7X/week     Co-evaluation               AM-PAC PT "6 Clicks" Mobility  Outcome Measure Help needed turning from your back to your side while in a flat bed without using bedrails?: None Help needed moving from lying on your back to sitting on the side of a flat bed without using bedrails?: None Help needed moving to and from a bed to a chair (including a wheelchair)?: A Little Help needed standing up from a chair using your arms (e.g., wheelchair or bedside chair)?: A Little Help needed to  walk in hospital room?: A Little Help needed climbing 3-5 steps with a railing? : A Little 6 Click Score: 20    End of Session Equipment Utilized During Treatment: Gait belt Activity Tolerance: Patient tolerated treatment well Patient left: with call bell/phone within reach;with family/visitor present Nurse Communication: Mobility status;Other (comment) (pt readiness for d/c) PT Visit Diagnosis: Unsteadiness on feet (R26.81);Other abnormalities of gait and mobility (R26.89);Muscle weakness (generalized) (M62.81);History of falling (Z91.81);Pain;Difficulty in walking, not elsewhere classified (R26.2) Pain - Right/Left:  Right Pain - part of body: Hip;Knee;Leg    Time: 1610-9604 PT Time Calculation (min) (ACUTE ONLY): 23 min   Charges:     PT Treatments $Gait Training: 8-22 mins $Therapeutic Activity: 8-22 mins PT General Charges $$ ACUTE PT VISIT: 1 Visit         Johnny Bridge, PT Acute Rehab   Jacqualyn Posey 03/25/2023, 3:59 PM

## 2023-03-24 NOTE — Interval H&P Note (Signed)
History and Physical Interval Note:  03/24/2023 6:35 AM  Wendy [name]  has presented today for surgery, with the diagnosis of Right knee osteoarthritis.  The various methods of treatment have been discussed with the patient and family. After consideration of risks, benefits and other options for treatment, the patient has consented to  Procedure(s): TOTAL KNEE ARTHROPLASTY (Right) as a surgical intervention.  The patient's history has been reviewed, patient examined, no change in status, stable for surgery.  I have reviewed the patient's chart and labs.  Questions were answered to the patient's satisfaction.     Homero Fellers [facility]

## 2023-03-24 NOTE — Transfer of Care (Signed)
Immediate Anesthesia Transfer of Care Note  Patient: Wendy [name]  Procedure(s) Performed: RIGHT TOTAL KNEE ARTHROPLASTY (Right: Knee)  Patient Location: PACU  Anesthesia Type:Spinal  Level of Consciousness: awake, alert , oriented, and patient cooperative  Airway & Oxygen Therapy: Patient Spontanous Breathing and Patient connected to face mask oxygen  Post-op Assessment: Report given to RN, Post -op Vital signs reviewed and stable, and Patient moving all extremities X 4  Post vital signs: Reviewed and stable  Last Vitals:  Vitals Value Taken Time  BP 142/61 03/24/23 0839  Temp 36.6 C 03/24/23 0839  Pulse 92 03/24/23 0841  Resp 15 03/24/23 0841  SpO2 100 % 03/24/23 0841  Vitals shown include unfiled device data.  Last Pain:  Vitals:   03/24/23 0839  TempSrc:   PainSc: 0-No pain      Patients Stated Pain Goal: 4 (03/24/23 0543)  Complications: No notable events documented.

## 2023-03-24 NOTE — Discharge Instructions (Signed)
 Frank Aluisio, MD Total Joint Specialist [facility] [address] [place], [place] [address] [phone]  TOTAL KNEE REPLACEMENT POSTOPERATIVE DIRECTIONS    Knee Rehabilitation, Guidelines Following Surgery  Results after knee surgery are often greatly improved when you follow the exercise, range of motion and muscle strengthening exercises prescribed by your doctor. Safety measures are also important to protect the knee from further injury. If any of these exercises cause you to have increased pain or swelling in your knee joint, decrease the amount until you are comfortable again and slowly increase them. If you have problems or questions, call your caregiver or physical therapist for advice.   BLOOD CLOT PREVENTION Take an 81 mg Aspirin two times a day for three weeks following surgery. Then resume one 81 mg Aspirin once a day. You may resume your vitamins/supplements upon discharge from the hospital. Do not take any NSAIDs (Advil, Aleve, Ibuprofen, Meloxicam, etc.) until you are 3 weeks out from surgery  HOME CARE INSTRUCTIONS  Remove items at home which could result in a fall. This includes throw rugs or furniture in walking pathways.  ICE to the affected knee as much as tolerated. Icing helps control swelling. If the swelling is well controlled you will be more comfortable and rehab easier. Continue to use ice on the knee for pain and swelling from surgery. You may notice swelling that will progress down to the foot and ankle. This is normal after surgery. Elevate the leg when you are not up walking on it.    Continue to use the breathing machine which will help keep your temperature down. It is common for your temperature to cycle up and down following surgery, especially at night when you are not up moving around and exerting yourself. The breathing machine keeps your lungs expanded and your temperature down. Do not place pillow under the operative  knee, focus on keeping the knee straight while resting  DIET You may resume your previous home diet once you are discharged from the hospital.  DRESSING / WOUND CARE / SHOWERING Keep your bulky bandage on for 2 days. On the third post-operative day you may remove the Ace bandage and gauze. There is a waterproof adhesive bandage on your skin which will stay in place until your first follow-up appointment. Once you remove this you will not need to place another bandage You may begin showering 3 days following surgery, but do not submerge the incision under water.  ACTIVITY For the first 5 days, the key is rest and control of pain and swelling Do your home exercises twice a day starting on post-operative day 3. On the days you go to physical therapy, just do the home exercises once that day. You should rest, ice and elevate the leg for 50 minutes out of every hour. Get up and walk/stretch for 10 minutes per hour. After 5 days you can increase your activity slowly as tolerated. Walk with your walker as instructed. Use the walker until you are comfortable transitioning to a cane. Walk with the cane in the opposite hand of the operative leg. You may discontinue the cane once you are comfortable and walking steadily. Avoid periods of inactivity such as sitting longer than an hour when not asleep. This helps prevent blood clots.  You may discontinue the knee immobilizer once you are able to perform a straight leg raise while lying down. You may resume a sexual relationship in one month or when given the OK by   your doctor.  You may return to work once you are cleared by your doctor.  Do not drive a car for 6 weeks or until released by your surgeon.  Do not drive while taking narcotics.  TED HOSE STOCKINGS Wear the elastic stockings on both legs for three weeks following surgery during the day. You may remove them at night for sleeping.  WEIGHT BEARING Weight bearing as tolerated with assist device  (walker, cane, etc) as directed, use it as long as suggested by your surgeon or therapist, typically at least 4-6 weeks.  POSTOPERATIVE CONSTIPATION PROTOCOL Constipation - defined medically as fewer than three stools per week and severe constipation as less than one stool per week.  One of the most common issues patients have following surgery is constipation.  Even if you have a regular bowel pattern at home, your normal regimen is likely to be disrupted due to multiple reasons following surgery.  Combination of anesthesia, postoperative narcotics, change in appetite and fluid intake all can affect your bowels.  In order to avoid complications following surgery, here are some recommendations in order to help you during your recovery period.  Colace (docusate) - Pick up an over-the-counter form of Colace or another stool softener and take twice a day as long as you are requiring postoperative pain medications.  Take with a full glass of water daily.  If you experience loose stools or diarrhea, hold the colace until you stool forms back up. If your symptoms do not get better within 1 week or if they get worse, check with your doctor. Dulcolax (bisacodyl) - Pick up over-the-counter and take as directed by the product packaging as needed to assist with the movement of your bowels.  Take with a full glass of water.  Use this product as needed if not relieved by Colace only.  MiraLax (polyethylene glycol) - Pick up over-the-counter to have on hand. MiraLax is a solution that will increase the amount of water in your bowels to assist with bowel movements.  Take as directed and can mix with a glass of water, juice, soda, coffee, or tea. Take if you go more than two days without a movement. Do not use MiraLax more than once per day. Call your doctor if you are still constipated or irregular after using this medication for 7 days in a row.  If you continue to have problems with postoperative constipation, please  contact the office for further assistance and recommendations.  If you experience "the worst abdominal pain ever" or develop nausea or vomiting, please contact the office immediatly for further recommendations for treatment.  ITCHING If you experience itching with your medications, try taking only a single pain pill, or even half a pain pill at a time.  You can also use Benadryl over the counter for itching or also to help with sleep.   MEDICATIONS See your medication summary on the "After Visit Summary" that the nursing staff will review with you prior to discharge.  You may have some home medications which will be placed on hold until you complete the course of blood thinner medication.  It is important for you to complete the blood thinner medication as prescribed by your surgeon.  Continue your approved medications as instructed at time of discharge.  PRECAUTIONS If you experience chest pain or shortness of breath - call 911 immediately for transfer to the hospital emergency department.  If you develop a fever greater that 101 F, purulent drainage from wound, increased redness   or drainage from wound, foul odor from the wound/dressing, or calf pain - CONTACT YOUR SURGEON.                                                   FOLLOW-UP APPOINTMENTS Make sure you keep all of your appointments after your operation with your surgeon and caregivers. You should call the office at the above phone number and make an appointment for approximately two weeks after the date of your surgery or on the date instructed by your surgeon outlined in the "After Visit Summary".  RANGE OF MOTION AND STRENGTHENING EXERCISES  Rehabilitation of the knee is important following a knee injury or an operation. After just a few days of immobilization, the muscles of the thigh which control the knee become weakened and shrink (atrophy). Knee exercises are designed to build up the tone and strength of the thigh muscles and to  improve knee motion. Often times heat used for twenty to thirty minutes before working out will loosen up your tissues and help with improving the range of motion but do not use heat for the first two weeks following surgery. These exercises can be done on a training (exercise) mat, on the floor, on a table or on a bed. Use what ever works the best and is most comfortable for you Knee exercises include:  Leg Lifts - While your knee is still immobilized in a splint or cast, you can do straight leg raises. Lift the leg to 60 degrees, hold for 3 sec, and slowly lower the leg. Repeat 10-20 times 2-3 times daily. Perform this exercise against resistance later as your knee gets better.  Quad and Hamstring Sets - Tighten up the muscle on the front of the thigh (Quad) and hold for 5-10 sec. Repeat this 10-20 times hourly. Hamstring sets are done by pushing the foot backward against an object and holding for 5-10 sec. Repeat as with quad sets.  Leg Slides: Lying on your back, slowly slide your foot toward your buttocks, bending your knee up off the floor (only go as far as is comfortable). Then slowly slide your foot back down until your leg is flat on the floor again. Angel Wings: Lying on your back spread your legs to the side as far apart as you can without causing discomfort.  A rehabilitation program following serious knee injuries can speed recovery and prevent re-injury in the future due to weakened muscles. Contact your doctor or a physical therapist for more information on knee rehabilitation.   POST-OPERATIVE OPIOID TAPER INSTRUCTIONS: It is important to wean off of your opioid medication as soon as possible. If you do not need pain medication after your surgery it is ok to stop day one. Opioids include: Codeine, Hydrocodone(Norco, Vicodin), Oxycodone(Percocet, oxycontin) and hydromorphone amongst others.  Long term and even short term use of opiods can cause: Increased pain  response Dependence Constipation Depression Respiratory depression And more.  Withdrawal symptoms can include Flu like symptoms Nausea, vomiting And more Techniques to manage these symptoms Hydrate well Eat regular healthy meals Stay active Use relaxation techniques(deep breathing, meditating, yoga) Do Not substitute Alcohol to help with tapering If you have been on opioids for less than two weeks and do not have pain than it is ok to stop all together.  Plan to wean off of opioids This   plan should start within one week post op of your joint replacement. Maintain the same interval or time between taking each dose and first decrease the dose.  Cut the total daily intake of opioids by one tablet each day Next start to increase the time between doses. The last dose that should be eliminated is the evening dose.   IF YOU ARE TRANSFERRED TO A SKILLED REHAB FACILITY If the patient is transferred to a skilled rehab facility following release from the hospital, a list of the current medications will be sent to the facility for the patient to continue.  When discharged from the skilled rehab facility, please have the facility set up the patient's Home Health Physical Therapy prior to being released. Also, the skilled facility will be responsible for providing the patient with their medications at time of release from the facility to include their pain medication, the muscle relaxants, and their blood thinner medication. If the patient is still at the rehab facility at time of the two week follow up appointment, the skilled rehab facility will also need to assist the patient in arranging follow up appointment in our office and any transportation needs.  MAKE SURE YOU:  Understand these instructions.  Get help right away if you are not doing well or get worse.   DENTAL ANTIBIOTICS:  In most cases prophylactic antibiotics for Dental procdeures after total joint surgery are not  necessary.  Exceptions are as follows:  1. History of prior total joint infection  2. Severely immunocompromised (Organ Transplant, cancer chemotherapy, Rheumatoid biologic meds such as Humera)  3. Poorly controlled diabetes (A1C &gt; 8.0, blood glucose over 200)  If you have one of these conditions, contact your surgeon for an antibiotic prescription, prior to your dental procedure.    Pick up stool softner and laxative for home use following surgery while on pain medications. Do not submerge incision under water. Please use good hand washing techniques while changing dressing each day. May shower starting three days after surgery. Please use a clean towel to pat the incision dry following showers. Continue to use ice for pain and swelling after surgery. Do not use any lotions or creams on the incision until instructed by your surgeon.  

## 2023-03-25 ENCOUNTER — Encounter (HOSPITAL_COMMUNITY): Payer: Self-pay | Admitting: Orthopedic Surgery

## 2023-03-25 DIAGNOSIS — Z87891 Personal history of nicotine dependence: Secondary | ICD-10-CM | POA: Diagnosis not present

## 2023-03-25 DIAGNOSIS — M1711 Unilateral primary osteoarthritis, right knee: Secondary | ICD-10-CM | POA: Diagnosis not present

## 2023-03-25 DIAGNOSIS — Z96651 Presence of right artificial knee joint: Secondary | ICD-10-CM | POA: Diagnosis not present

## 2023-03-25 LAB — BASIC METABOLIC PANEL
Anion gap: 6 (ref 5–15)
BUN: 9 mg/dL (ref 8–23)
CO2: 23 mmol/L (ref 22–32)
Calcium: 8 mg/dL — ABNORMAL LOW (ref 8.9–10.3)
Chloride: 107 mmol/L (ref 98–111)
Creatinine, Ser: 0.66 mg/dL (ref 0.44–1.00)
GFR, Estimated: >60 mL/min (ref >60)
Glucose, Bld: 116 mg/dL — ABNORMAL HIGH (ref 70–99)
Potassium: 3.4 mmol/L — ABNORMAL LOW (ref 3.5–5.1)
Sodium: 136 mmol/L (ref 135–145)

## 2023-03-25 LAB — CBC
HCT: 30.7 % — ABNORMAL LOW (ref 36.0–46.0)
Hemoglobin: 10 g/dL — ABNORMAL LOW (ref 12.0–15.0)
MCH: 29.9 pg (ref 26.0–34.0)
MCHC: 32.6 g/dL (ref 30.0–36.0)
MCV: 91.6 fL (ref 80.0–100.0)
Platelets: 169 10*3/uL (ref 150–400)
RBC: 3.35 MIL/uL — ABNORMAL LOW (ref 3.87–5.11)
RDW: 12.4 % (ref 11.5–15.5)
WBC: 7.7 10*3/uL (ref 4.0–10.5)
nRBC: 0 % (ref 0.0–0.2)

## 2023-03-25 MED ORDER — ONDANSETRON HCL 4 MG PO TABS: 4.0000 mg | ORAL_TABLET | Freq: Four times a day (QID) | ORAL | 0 refills | Status: AC | PRN

## 2023-03-25 MED ORDER — HYDROMORPHONE HCL 2 MG PO TABS: 2.0000 mg | ORAL_TABLET | Freq: Three times a day (TID) | ORAL | 0 refills | Status: DC | PRN

## 2023-03-25 MED ORDER — ASPIRIN 81 MG PO CHEW: 81.0000 mg | CHEWABLE_TABLET | Freq: Two times a day (BID) | ORAL | 0 refills | Status: AC

## 2023-03-25 MED ORDER — METHOCARBAMOL 500 MG PO TABS: 500.0000 mg | ORAL_TABLET | Freq: Four times a day (QID) | ORAL | 0 refills | Status: DC | PRN

## 2023-03-25 MED ORDER — PROMETHAZINE HCL 12.5 MG PO TABS: 6.2500 mg | ORAL_TABLET | Freq: Four times a day (QID) | ORAL | 0 refills | Status: DC | PRN

## 2023-03-25 NOTE — Plan of Care (Signed)
  Problem: Education: Goal: Knowledge of the prescribed therapeutic regimen will improve Outcome: Progressing   Problem: Activity: Goal: Ability to avoid complications of mobility impairment will improve Outcome: Progressing Goal: Range of joint motion will improve Outcome: Adequate for Discharge   Problem: Clinical Measurements: Goal: Postoperative complications will be avoided or minimized Outcome: Progressing   Problem: Pain Management: Goal: Pain level will decrease with appropriate interventions Outcome: Progressing   Problem: Skin Integrity: Goal: Will show signs of wound healing Outcome: Progressing   Problem: Health Behavior/Discharge Planning: Goal: Ability to manage health-related needs will improve Outcome: Progressing   Problem: Clinical Measurements: Goal: Ability to maintain clinical measurements within normal limits will improve Outcome: Progressing Goal: Will remain free from infection Outcome: Progressing Goal: Diagnostic test results will improve Outcome: Progressing Goal: Respiratory complications will improve Outcome: Progressing Goal: Cardiovascular complication will be avoided Outcome: Progressing   Problem: Activity: Goal: Risk for activity intolerance will decrease Outcome: Progressing   Problem: Nutrition: Goal: Adequate nutrition will be maintained Outcome: Progressing   Problem: Coping: Goal: Level of anxiety will decrease Outcome: Progressing   Problem: Elimination: Goal: Will not experience complications related to bowel motility Outcome: Progressing Goal: Will not experience complications related to urinary retention Outcome: Progressing   Problem: Pain Managment: Goal: General experience of comfort will improve Outcome: Progressing   Problem: Safety: Goal: Ability to remain free from injury will improve Outcome: Progressing   Problem: Skin Integrity: Goal: Risk for impaired skin integrity will decrease Outcome:  Progressing

## 2023-03-25 NOTE — Progress Notes (Signed)
Patient discharged to home w/ family. Given all belongings, instructions, equipment. Verbalized understanding of instructions. Escorted to pov via w/c. 

## 2023-03-25 NOTE — TOC Transition Note (Signed)
Transition of Care [facility]) - CM/SW Discharge Note   Patient Details  Name: Wendy [name] MRN: [id number] Date of Birth: [birth year]  Transition of Care [facility]) CM/SW Contact:  Amada Jupiter, LCSW Phone Number: 03/25/2023, 11:24 AM   Clinical Narrative:    Met with pt and daughter and confirming RW has been delivered to room via [employer].  OPPT already arranged with [facility].  No further TOC needs.   Final next level of care: OP Rehab Barriers to Discharge: No Barriers Identified   Patient Goals and CMS Choice      Discharge Placement                         Discharge Plan and Services Additional resources added to the After Visit Summary for                  DME Arranged: Walker rolling DME Agency: [employer]                  Social Determinants of Health (SDOH) Interventions SDOH Screenings   Food Insecurity: No Food Insecurity (03/24/2023)  Housing: Low Risk  (03/24/2023)  Transportation Needs: No Transportation Needs (03/24/2023)  Utilities: Not At Risk (03/24/2023)  Tobacco Use: Medium Risk (03/24/2023)     Readmission Risk Interventions     No data to display

## 2023-03-25 NOTE — Progress Notes (Signed)
   Subjective: 1 Day Post-Op Procedure(s) (LRB): RIGHT TOTAL KNEE ARTHROPLASTY (Right) Patient reports pain as mild.   Patient seen in rounds by Dr. Lequita Halt. Patient had increased pain yesterday, then nausea due to multiple pain meds. Feeling better this morning.  Denies chest pain, SOB, or calf pain. Foley catheter removed this AM.  We will continue therapy today, ambulated 20' yesterday.   Objective: Vital signs in last 24 hours: Temp:  [97.8 F (36.6 C)-98.9 F (37.2 C)] 98 F (36.7 C) (07/23 0502) Pulse Rate:  [66-94] 76 (07/23 0502) Resp:  [12-19] 17 (07/23 0502) BP: (122-166)/(61-88) 128/71 (07/23 0502) SpO2:  [93 %-100 %] 93 % (07/23 0502)  Intake/Output from previous day:  Intake/Output Summary (Last 24 hours) at 03/25/2023 0832 Last data filed at 03/25/2023 0740 Gross per 24 hour  Intake 1984.78 ml  Output 3025 ml  Net -1040.22 ml     Intake/Output this shift: Total I/O In: 125 [I.V.:125] Out: -   Labs: Recent Labs    03/25/23 0331  HGB 10.0*   Recent Labs    03/25/23 0331  WBC 7.7  RBC 3.35*  HCT 30.7*  PLT 169   Recent Labs    03/25/23 0331  NA 136  K 3.4*  CL 107  CO2 23  BUN 9  CREATININE 0.66  GLUCOSE 116*  CALCIUM 8.0*   No results for input(s): "LABPT", "INR" in the last 72 hours.  Exam: General - Patient is Alert and Oriented Extremity - Neurologically intact Neurovascular intact Sensation intact distally Dorsiflexion/Plantar flexion intact Dressing - dressing C/D/I Motor Function - intact, moving foot and toes well on exam.   Past Medical History:  Diagnosis Date   Anemia    Anxiety    Chronic constipation    Family history of adverse reaction to anesthesia    Mother N&V   Fibromyalgia    GERD (gastroesophageal reflux disease)    Hypercholesteremia    Migraine    Pneumonia    PONV (postoperative nausea and vomiting)    Restless leg syndrome    Rheumatoid arthritis(714.0)     Assessment/Plan: 1 Day Post-Op  Procedure(s) (LRB): RIGHT TOTAL KNEE ARTHROPLASTY (Right) Principal Problem:   OA (osteoarthritis) of knee Active Problems:   Primary osteoarthritis of right knee  Estimated body mass index is 25.54 kg/m as calculated from the following:   Height as of this encounter: 5\' 4"  (1.626 m).   Weight as of this encounter: 67.5 kg. Advance diet Up with therapy D/C IV fluids   Patient's anticipated LOS is less than 2 midnights, meeting these requirements: - Lives within 1 hour of care - Has a competent adult at home to recover with post-op recover - NO history of  - Chronic pain requiring opioids  - Diabetes  - Coronary Artery Disease  - Heart failure  - Heart attack  - Stroke  - DVT/VTE  - Cardiac arrhythmia  - Respiratory Failure/COPD  - Renal failure  - Anemia  - Advanced Liver disease  DVT Prophylaxis - Aspirin Weight bearing as tolerated. Continue therapy.  Plan is to go Home after hospital stay. Plan for discharge later today if progresses with therapy and meeting goals. Scheduled for OPP[facility] at [facility]. Follow-up in the office in 2 weeks.  The PDMP database was reviewed today prior to any opioid medications being prescribed to this patient.  Arther Abbott, PA-C Orthopedic Surgery [phone] 03/25/2023, 8:32 AM

## 2023-03-25 NOTE — Progress Notes (Signed)
Physical Therapy Treatment Patient Details Name: Wendy [name] MRN: [id number] DOB: [birth year] Today's Date: 03/25/2023   History of Present Illness 72 yo female presents to therapy s/p R TKA on 03/24/2023 due to failure of conservative measures. Pt PMH includes but is not limited to: R foot fx, anemia, anxiety, fibromyalgia, HLD, RLS, and RA.    PT Comments   Wendy [name] is a 72 y.o. female POD 1 s/p R TKA. Patient reports IND with mobility at baseline. Patient is now limited by functional impairments (see PT problem list below). Pt in bed when PT arrived for PM session. Pt is mod I for bed mobility and S for transfers bed. Patient was able to ambulate 100 feet with RW and min guard progressing to close S. Step to antalgic pattern with slow cadence.  Pt and family instruction provided on step/curb navigation with use of RW and min guard with proper sequencing and technique. Pt ed provide don car transfers, pain management, fall risk prevention and no questions or concerns in regards to HEP.. Patient will benefit from continued skilled PT interventions to address impairments and progress towards PLOF. Acute PT will follow to progress mobility and stair training in preparation for safe discharge home with family assist and OPPT services.     Assistance Recommended at Discharge Intermittent Supervision/Assistance  If plan is discharge home, recommend the following:  Can travel by private vehicle    A little help with walking and/or transfers;A little help with bathing/dressing/bathroom;Assistance with cooking/housework;Assist for transportation;Help with stairs or ramp for entrance      Equipment Recommendations  Rolling walker (2 wheels)    Recommendations for Other Services       Precautions / Restrictions Precautions Precautions: Fall;Knee Restrictions Weight Bearing Restrictions: No     Mobility  Bed Mobility Overal bed mobility: Modified Independent Bed Mobility: Supine to Sit,  Sit to Supine     Supine to sit: HOB elevated, Supervision Sit to supine: Supervision   General bed mobility comments: increased time and HOB elevated    Transfers Overall transfer level: Needs assistance Equipment used: Rolling walker (2 wheels) Transfers: Sit to/from Stand Sit to Stand: Supervision           General transfer comment: cues for proper UE placement push to stand with R UE, minimal R LE WB with transition, bed and commode transfer    Ambulation/Gait Ambulation/Gait assistance: Min guard Gait Distance (Feet): 100 Feet Assistive device: Rolling walker (2 wheels) Gait Pattern/deviations: Step-to pattern, Decreased stance time - right, Antalgic Gait velocity: decreased     General Gait Details: heavy reliance on B UE support at RW, short choppy steps with cues for use of UE support to control impact on L foot with improved return demonstration.   Stairs Stairs: Yes Stairs assistance: Min guard Stair Management: No rails, With walker Number of Stairs: 1 General stair comments: 1 curb x 2 trials with cues for proper sequencing and technique   Wheelchair Mobility     Tilt Bed    Modified Rankin (Stroke Patients Only)       Balance Overall balance assessment: Needs assistance, History of Falls (R knee instability and tripps over dogs, slid in bathtub) Sitting-balance support: Feet supported Sitting balance-Leahy Scale: Good     Standing balance support: Bilateral upper extremity supported, During functional activity, Reliant on assistive device for balance Standing balance-Leahy Scale: Fair Standing balance comment: pt demonstrated LOB without UE support with ADLs at sink and able to implement  self recovery stratagies pt continues to self limit R LE WB and cues for placing whole R foot on floor vs toe touch                            Cognition Arousal/Alertness: Awake/alert Behavior During Therapy: WFL for tasks  assessed/performed Overall Cognitive Status: Within Functional Limits for tasks assessed                                          Exercises Total Joint Exercises Ankle Circles/Pumps: AROM, Both, 10 reps Quad Sets: AROM, Right, 5 reps (tactile cues) Short Arc Quad: AAROM, Right, 5 reps Heel Slides: AROM, Right, 5 reps (limited range) Hip ABduction/ADduction: AROM, Right, 5 reps Straight Leg Raises: AROM, Right, 5 reps Knee Flexion: AROM, Right, 5 reps, Seated (pt compensating but offloading R hip/buttocks in sitting)    General Comments        Pertinent Vitals/Pain Pain Assessment Pain Assessment: 0-10 Pain Score: 7  Pain Location: R hip to ankle Pain Descriptors / Indicators: Aching, Constant, Discomfort, Operative site guarding Pain Intervention(s): Limited activity within patient's tolerance, Monitored during session, Premedicated before session, Repositioned, Ice applied (pain medication ~ prior to tx session)    Home Living Family/patient expects to be discharged to:: Private residence Living Arrangements: Spouse/significant other;Parent Available Help at Discharge: Family Type of Home: House Home Access: Stairs to enter Entrance Stairs-Rails: None Entrance Stairs-Number of Steps: 1 Alternate Level Stairs-Number of Steps: stair lift to access Home Layout: Two level;Able to live on main level with bedroom/bathroom Home Equipment: Tub bench;Cane - single point;Rollator (4 wheels)      Prior Function            PT Goals (current goals can now be found in the care plan section) Acute Rehab PT Goals Patient Stated Goal: to be able to fly to [place] in 8 wks PT Goal Formulation: With patient/family Time For Goal Achievement: 04/14/23 Potential to Achieve Goals: Good    Frequency    7X/week      PT Plan      Co-evaluation              AM-PAC PT "6 Clicks" Mobility   Outcome Measure  Help needed turning from your back to your side  while in a flat bed without using bedrails?: None Help needed moving from lying on your back to sitting on the side of a flat bed without using bedrails?: None Help needed moving to and from a bed to a chair (including a wheelchair)?: A Little Help needed standing up from a chair using your arms (e.g., wheelchair or bedside chair)?: A Little Help needed to walk in hospital room?: A Little Help needed climbing 3-5 steps with a railing? : A Little 6 Click Score: 20    End of Session Equipment Utilized During Treatment: Gait belt Activity Tolerance: Patient tolerated treatment well Patient left: with call bell/phone within reach;with family/visitor present Nurse Communication: Mobility status;Other (comment) (pt readiness for d/c) PT Visit Diagnosis: Unsteadiness on feet (R26.81);Other abnormalities of gait and mobility (R26.89);Muscle weakness (generalized) (M62.81);History of falling (Z91.81);Pain;Difficulty in walking, not elsewhere classified (R26.2) Pain - Right/Left: Right Pain - part of body: Hip;Knee;Leg     Time: 1610-9604 PT Time Calculation (min) (ACUTE ONLY): 23 min  Charges:    $Gait Training: 8-22  mins $Therapeutic Activity: 8-22 mins PT General Charges $$ ACUTE PT VISIT: 1 Visit                     Johnny Bridge, PT Acute Rehab    Jacqualyn Posey 03/25/2023, 4:01 PM

## 2023-03-25 NOTE — Progress Notes (Signed)
Physical Therapy Treatment Patient Details Name: Wendy [name] MRN: [id number] DOB: 1951-02-11 Today's Date: 03/25/2023   History of Present Illness 72 yo female presents to therapy s/p R TKA on 03/24/2023 due to failure of conservative measures. Pt PMH includes but is not limited to: R foot fx, anemia, anxiety, fibromyalgia, HLD, RLS, and RA.    PT Comments    MARDENE LESSIG is a 72 y.o. female POD 1 s/p R TKA. Patient reports IND with mobility at baseline. Patient is now limited by functional impairments (see PT problem list below). Pt in bed when PT arrived for am session. Pt requires S for bed mobility and S for transfers bed and commode with elevated commode seat in place. Patient was able to ambulate 85 feet with RW and min guard progressing to close S. Step to antalgic pattern with slow cadence.  Patient instructed in exercise to facilitate ROM and circulation to manage edema reviewed and HO provided. Patient will benefit from continued skilled PT interventions to address impairments and progress towards PLOF. Acute PT will follow to progress mobility and stair training in preparation for safe discharge home with family assist and OPPT services.     Assistance Recommended at Discharge Intermittent Supervision/Assistance  If plan is discharge home, recommend the following:  Can travel by private vehicle    A little help with walking and/or transfers;A little help with bathing/dressing/bathroom;Assistance with cooking/housework;Assist for transportation;Help with stairs or ramp for entrance      Equipment Recommendations  Rolling walker (2 wheels)    Recommendations for Other Services       Precautions / Restrictions Precautions Precautions: Fall;Knee Restrictions Weight Bearing Restrictions: No     Mobility  Bed Mobility Overal bed mobility: Needs Assistance Bed Mobility: Supine to Sit, Sit to Supine     Supine to sit: HOB elevated, Supervision Sit to supine: Supervision    General bed mobility comments: increased time and cues    Transfers Overall transfer level: Needs assistance Equipment used: Rolling walker (2 wheels) Transfers: Sit to/from Stand Sit to Stand: Supervision           General transfer comment: cues for proper UE placement push to stand with R UE, minimal R LE WB with transition, bed and commode transfer    Ambulation/Gait Ambulation/Gait assistance: Min guard Gait Distance (Feet): 85 Feet Assistive device: Rolling walker (2 wheels) Gait Pattern/deviations: Step-to pattern, Decreased stance time - right, Antalgic Gait velocity: decreased     General Gait Details: heavy reliance on B UE support at RW, short choppy steps with cues for use of UE support to control impact on L foot, pt demonstrated improved gait tolerance with am session   Stairs             Wheelchair Mobility     Tilt Bed    Modified Rankin (Stroke Patients Only)       Balance Overall balance assessment: Needs assistance, History of Falls (R knee instability and tripps over dogs, slid in bathtub) Sitting-balance support: Feet supported Sitting balance-Leahy Scale: Good     Standing balance support: Bilateral upper extremity supported, During functional activity, Reliant on assistive device for balance Standing balance-Leahy Scale: Fair Standing balance comment: pt demonstrated LOB without UE support with ADLs at sink and able to implement self recovery stratagies pt continues to self limit R LE WB and cues for placing whole R foot on floor vs toe touch  Cognition Arousal/Alertness: Awake/alert Behavior During Therapy: WFL for tasks assessed/performed Overall Cognitive Status: Within Functional Limits for tasks assessed                                          Exercises Total Joint Exercises Ankle Circles/Pumps: AROM, Both, 10 reps Quad Sets: AROM, Right, 5 reps (tactile cues) Short  Arc Quad: AAROM, Right, 5 reps Heel Slides: AROM, Right, 5 reps (limited range) Hip ABduction/ADduction: AROM, Right, 5 reps Straight Leg Raises: AROM, Right, 5 reps Knee Flexion: AROM, Right, 5 reps, Seated (pt compensating but offloading R hip/buttocks in sitting)    General Comments        Pertinent Vitals/Pain Pain Assessment Pain Assessment: 0-10 Pain Score: 5  Pain Location: R hip to ankle Pain Descriptors / Indicators: Aching, Constant, Discomfort, Operative site guarding Pain Intervention(s): Limited activity within patient's tolerance, Premedicated before session, Monitored during session, Repositioned, Ice applied    Home Living Family/patient expects to be discharged to:: Private residence Living Arrangements: Spouse/significant other;Parent Available Help at Discharge: Family Type of Home: House Home Access: Stairs to enter Entrance Stairs-Rails: None Entrance Stairs-Number of Steps: 1 Alternate Level Stairs-Number of Steps: stair lift to access Home Layout: Two level;Able to live on main level with bedroom/bathroom Home Equipment: Tub bench;Cane - single point;Rollator (4 wheels)      Prior Function            PT Goals (current goals can now be found in the care plan section) Acute Rehab PT Goals Patient Stated Goal: to be able to fly to [place] in 8 wks PT Goal Formulation: With patient/family Time For Goal Achievement: 04/14/23 Potential to Achieve Goals: Good Progress towards PT goals: Progressing toward goals    Frequency    7X/week      PT Plan Current plan remains appropriate    Co-evaluation              AM-PAC PT "6 Clicks" Mobility   Outcome Measure  Help needed turning from your back to your side while in a flat bed without using bedrails?: None Help needed moving from lying on your back to sitting on the side of a flat bed without using bedrails?: A Little Help needed moving to and from a bed to a chair (including a wheelchair)?: A  Little Help needed standing up from a chair using your arms (e.g., wheelchair or bedside chair)?: A Little Help needed to walk in hospital room?: A Little Help needed climbing 3-5 steps with a railing? : A Lot 6 Click Score: 18    End of Session Equipment Utilized During Treatment: Gait belt Activity Tolerance: Patient tolerated treatment well Patient left: with call bell/phone within reach;with family/visitor present Nurse Communication: Mobility status PT Visit Diagnosis: Unsteadiness on feet (R26.81);Other abnormalities of gait and mobility (R26.89);Muscle weakness (generalized) (M62.81);History of falling (Z91.81);Pain;Difficulty in walking, not elsewhere classified (R26.2) Pain - Right/Left: Right Pain - part of body: Hip;Knee;Leg     Time: 8657-8469 PT Time Calculation (min) (ACUTE ONLY): 46 min  Charges:    $Gait Training: 8-22 mins $Therapeutic Exercise: 8-22 mins $Therapeutic Activity: 8-22 mins PT General Charges $$ ACUTE PT VISIT: 1 Visit                     Johnny Bridge, PT Acute Rehab    Jacqualyn Posey 03/25/2023, 1:08 PM

## 2023-03-28 DIAGNOSIS — Z96651 Presence of right artificial knee joint: Secondary | ICD-10-CM | POA: Diagnosis not present

## 2023-03-28 DIAGNOSIS — M6281 Muscle weakness (generalized): Secondary | ICD-10-CM | POA: Diagnosis not present

## 2023-03-28 DIAGNOSIS — M25561 Pain in right knee: Secondary | ICD-10-CM | POA: Diagnosis not present

## 2023-03-28 DIAGNOSIS — M25661 Stiffness of right knee, not elsewhere classified: Secondary | ICD-10-CM | POA: Diagnosis not present

## 2023-03-31 DIAGNOSIS — M25561 Pain in right knee: Secondary | ICD-10-CM | POA: Diagnosis not present

## 2023-03-31 DIAGNOSIS — Z96651 Presence of right artificial knee joint: Secondary | ICD-10-CM | POA: Diagnosis not present

## 2023-03-31 DIAGNOSIS — M25661 Stiffness of right knee, not elsewhere classified: Secondary | ICD-10-CM | POA: Diagnosis not present

## 2023-03-31 DIAGNOSIS — M6281 Muscle weakness (generalized): Secondary | ICD-10-CM | POA: Diagnosis not present

## 2023-04-02 DIAGNOSIS — M6281 Muscle weakness (generalized): Secondary | ICD-10-CM | POA: Diagnosis not present

## 2023-04-02 DIAGNOSIS — M25661 Stiffness of right knee, not elsewhere classified: Secondary | ICD-10-CM | POA: Diagnosis not present

## 2023-04-02 DIAGNOSIS — Z96651 Presence of right artificial knee joint: Secondary | ICD-10-CM | POA: Diagnosis not present

## 2023-04-02 DIAGNOSIS — M25561 Pain in right knee: Secondary | ICD-10-CM | POA: Diagnosis not present

## 2023-04-02 NOTE — Discharge Summary (Signed)
Patient ID: Wendy [name] MRN: [id number] DOB/AGE: 1951/01/23 72 y.o.  Admit date: 03/24/2023 Discharge date: 03/25/2023  Admission Diagnoses:  Principal Problem:   OA (osteoarthritis) of knee Active Problems:   Primary osteoarthritis of right knee   Discharge Diagnoses:  Same  Past Medical History:  Diagnosis Date   Anemia    Anxiety    Chronic constipation    Family history of adverse reaction to anesthesia    Mother N&V   Fibromyalgia    GERD (gastroesophageal reflux disease)    Hypercholesteremia    Migraine    Pneumonia    PONV (postoperative nausea and vomiting)    Restless leg syndrome    Rheumatoid arthritis(714.0)     Surgeries: Procedure(s): RIGHT TOTAL KNEE ARTHROPLASTY on 03/24/2023   Consultants:   Discharged Condition: Improved  Hospital Course: Wendy [name] is an 72 y.o. female who was admitted 03/24/2023 for operative treatment ofOA (osteoarthritis) of knee. Patient has severe unremitting pain that affects sleep, daily activities, and work/hobbies. After pre-op clearance the patient was taken to the operating room on 03/24/2023 and underwent  Procedure(s): RIGHT TOTAL KNEE ARTHROPLASTY.    Patient was given perioperative antibiotics:  Anti-infectives (From admission, onward)    Start     Dose/Rate Route Frequency Ordered Stop   03/24/23 1300  ceFAZolin (ANCEF) IVPB 2g/100 mL premix        2 g 200 mL/hr over 30 Minutes Intravenous Every 6 hours 03/24/23 1001 03/24/23 2000   03/24/23 0600  ceFAZolin (ANCEF) IVPB 2g/100 mL premix        2 g 200 mL/hr over 30 Minutes Intravenous On call to O.R. 03/24/23 0526 03/24/23 0720        Patient was given sequential compression devices, early ambulation, and chemoprophylaxis to prevent DVT.  Patient benefited maximally from hospital stay and there were no complications.    Recent vital signs: No data found.   Recent laboratory studies: No results for input(s): "WBC", "HGB", "HCT", "PLT", "NA", "K", "CL",  "CO2", "BUN", "CREATININE", "GLUCOSE", "INR", "CALCIUM" in the last 72 hours.  Invalid input(s): "PT", "2"   Discharge Medications:   Allergies as of 03/25/2023       Reactions   Guaifenesin Anaphylaxis   Pseudoephedrine Anaphylaxis   Codeine Nausea And Vomiting   Lyrica [pregabalin] Nausea And Vomiting   Mirapex [pramipexole Dihydrochloride] Nausea And Vomiting   Trazodone Nausea And Vomiting   Guaifenesin Er    Other Nausea And Vomiting   anesthesia   Pramipexole Anxiety   Other Reaction(s): Angioedema        Medication List     STOP taking these medications    aspirin 81 MG tablet Replaced by: aspirin 81 MG chewable tablet   Icy Hot Pain Relieving 2.5 % Gel Generic drug: Menthol (Topical Analgesic)   meloxicam 7.5 MG tablet Commonly known as: Mobic   tiZANidine 2 MG tablet Commonly known as: ZANAFLEX       TAKE these medications    amitriptyline 75 MG tablet Commonly known as: ELAVIL Take 75 mg by mouth at bedtime.   ascorbic acid 500 MG tablet Commonly known as: VITAMIN C Take 500 mg by mouth at bedtime.   aspirin 81 MG chewable tablet Chew 1 tablet (81 mg total) by mouth 2 (two) times daily for 20 days. Then resume one 81 mg aspirin once a day Replaces: aspirin 81 MG tablet   beta carotene 40981 UNIT capsule Take 25,000 Units by mouth at bedtime.  bisacodyl 5 MG EC tablet Commonly known as: DULCOLAX Take 5 mg by mouth daily as needed for mild constipation or moderate constipation.   calcium carbonate 1500 (600 Ca) MG Tabs tablet Commonly known as: OSCAL Take 600 mg of elemental calcium by mouth at bedtime.   CholestOff Plus 450 MG Caps Generic drug: Plant Sterols and Stanols Take 420 mg by mouth at bedtime.   clonazePAM 2 MG tablet Commonly known as: KLONOPIN Take 2 mg by mouth at bedtime.   ezetimibe 10 MG tablet Commonly known as: ZETIA Take 10 mg by mouth at bedtime.   famotidine 20 MG tablet Commonly known as: PEPCID Take 20  mg by mouth daily as needed for heartburn.   FISH OIL PO Take 500 mg by mouth at bedtime.   gabapentin 300 MG capsule Commonly known as: NEURONTIN Take 300 mg by mouth at bedtime.   Garlic 1000 MG Caps Take 1,000 mg by mouth at bedtime.   Ginger Root 550 MG Caps Take 550 mg by mouth daily.   GLUCOSAMINE PO Take 2,000 mg by mouth at bedtime.   HYDROmorphone 2 MG tablet Commonly known as: DILAUDID Take 1-2 tablets (2-4 mg total) by mouth every 8 (eight) hours as needed for severe pain.   loratadine 10 MG tablet Commonly known as: CLARITIN Take 1 tablet (10 mg total) by mouth daily.   Magnesium 400 MG Caps Take 400 mg by mouth at bedtime.   methocarbamol 500 MG tablet Commonly known as: ROBAXIN Take 1 tablet (500 mg total) by mouth every 6 (six) hours as needed for muscle spasms.   milk thistle 175 MG tablet Take 175 mg by mouth at bedtime.   ondansetron 4 MG tablet Commonly known as: ZOFRAN Take 4 mg by mouth every 8 (eight) hours as needed for nausea or vomiting. What changed: Another medication with the same name was added. Make sure you understand how and when to take each.   ondansetron 4 MG tablet Commonly known as: ZOFRAN Take 1 tablet (4 mg total) by mouth every 6 (six) hours as needed for nausea. What changed: You were already taking a medication with the same name, and this prescription was added. Make sure you understand how and when to take each.   OVER THE COUNTER MEDICATION Take 1,000 mg by mouth at bedtime. Marshmallow Root   Potassium 99 MG Tabs Take 99 mg by mouth at bedtime.   promethazine 12.5 MG tablet Commonly known as: PHENERGAN Take 0.5-1 tablets (6.25-12.5 mg total) by mouth every 6 (six) hours as needed for nausea or vomiting.   RED YEAST RICE EXTRACT PO Take 1,200 mg by mouth at bedtime.   sertraline 50 MG tablet Commonly known as: ZOLOFT Take 50 mg by mouth at bedtime.   topiramate 50 MG tablet Commonly known as: TOPAMAX Take 50  mg by mouth at bedtime.   TURMERIC CURCUMIN PO Take 1,500 mg by mouth at bedtime.   Vitamin B-12 5000 MCG Subl Place 5,000 mcg under the tongue at bedtime.   Vitamin D-3 125 MCG (5000 UT) Tabs Take 5,000 Units by mouth at bedtime.   Zinc Gluconate 50 MG Caps Take 50 mg by mouth at bedtime.               Discharge Care Instructions  (From admission, onward)           Start     Ordered   03/25/23 0000  Weight bearing as tolerated  03/25/23 0834   03/25/23 0000  Change dressing       Comments: You may remove the bulky bandage (ACE wrap and gauze) two days after surgery. You will have an adhesive waterproof bandage underneath. Leave this in place until your first follow-up appointment.   03/25/23 0834            Diagnostic Studies: No results found.  Disposition: Discharge disposition: 01-Home or Self Care       Discharge Instructions     Call MD / Call 911   Complete by: As directed    If you experience chest pain or shortness of breath, CALL 911 and be transported to the hospital emergency room.  If you develope a fever above 101 F, pus (white drainage) or increased drainage or redness at the wound, or calf pain, call your surgeon's office.   Change dressing   Complete by: As directed    You may remove the bulky bandage (ACE wrap and gauze) two days after surgery. You will have an adhesive waterproof bandage underneath. Leave this in place until your first follow-up appointment.   Constipation Prevention   Complete by: As directed    Drink plenty of fluids.  Prune juice may be helpful.  You may use a stool softener, such as Colace (over the counter) 100 mg twice a day.  Use MiraLax (over the counter) for constipation as needed.   Diet - low sodium heart healthy   Complete by: As directed    Do not put a pillow under the knee. Place it under the heel.   Complete by: As directed    Driving restrictions   Complete by: As directed    No driving for  two weeks   Post-operative opioid taper instructions:   Complete by: As directed    POST-OPERATIVE OPIOID TAPER INSTRUCTIONS: It is important to wean off of your opioid medication as soon as possible. If you do not need pain medication after your surgery it is ok to stop day one. Opioids include: Codeine, Hydrocodone(Norco, Vicodin), Oxycodone(Percocet, oxycontin) and hydromorphone amongst others.  Long term and even short term use of opiods can cause: Increased pain response Dependence Constipation Depression Respiratory depression And more.  Withdrawal symptoms can include Flu like symptoms Nausea, vomiting And more Techniques to manage these symptoms Hydrate well Eat regular healthy meals Stay active Use relaxation techniques(deep breathing, meditating, yoga) Do Not substitute Alcohol to help with tapering If you have been on opioids for less than two weeks and do not have pain than it is ok to stop all together.  Plan to wean off of opioids This plan should start within one week post op of your joint replacement. Maintain the same interval or time between taking each dose and first decrease the dose.  Cut the total daily intake of opioids by one tablet each day Next start to increase the time between doses. The last dose that should be eliminated is the evening dose.      TED hose   Complete by: As directed    Use stockings (TED hose) for three weeks on both leg(s).  You may remove them at night for sleeping.   Weight bearing as tolerated   Complete by: As directed         Follow-up Information     Ollen Gross, MD. Go on 04/08/2023.   Specialty: Orthopedic Surgery Why: You are scheduled for first post op appt on Tuesday August 6 at 2:30pm. Contact information:  [address] [place] [address] [phone]                  Signed: Arther Abbott 04/02/2023, 2:50 PM

## 2023-04-07 DIAGNOSIS — Z96651 Presence of right artificial knee joint: Secondary | ICD-10-CM | POA: Diagnosis not present

## 2023-04-07 DIAGNOSIS — M25661 Stiffness of right knee, not elsewhere classified: Secondary | ICD-10-CM | POA: Diagnosis not present

## 2023-04-07 DIAGNOSIS — M6281 Muscle weakness (generalized): Secondary | ICD-10-CM | POA: Diagnosis not present

## 2023-04-07 DIAGNOSIS — M25561 Pain in right knee: Secondary | ICD-10-CM | POA: Diagnosis not present

## 2023-04-11 DIAGNOSIS — M25661 Stiffness of right knee, not elsewhere classified: Secondary | ICD-10-CM | POA: Diagnosis not present

## 2023-04-11 DIAGNOSIS — Z96651 Presence of right artificial knee joint: Secondary | ICD-10-CM | POA: Diagnosis not present

## 2023-04-11 DIAGNOSIS — M6281 Muscle weakness (generalized): Secondary | ICD-10-CM | POA: Diagnosis not present

## 2023-04-11 DIAGNOSIS — M25561 Pain in right knee: Secondary | ICD-10-CM | POA: Diagnosis not present

## 2023-04-15 DIAGNOSIS — Z96651 Presence of right artificial knee joint: Secondary | ICD-10-CM | POA: Diagnosis not present

## 2023-04-15 DIAGNOSIS — M25661 Stiffness of right knee, not elsewhere classified: Secondary | ICD-10-CM | POA: Diagnosis not present

## 2023-04-15 DIAGNOSIS — M6281 Muscle weakness (generalized): Secondary | ICD-10-CM | POA: Diagnosis not present

## 2023-04-15 DIAGNOSIS — M25561 Pain in right knee: Secondary | ICD-10-CM | POA: Diagnosis not present

## 2023-04-17 DIAGNOSIS — M6281 Muscle weakness (generalized): Secondary | ICD-10-CM | POA: Diagnosis not present

## 2023-04-17 DIAGNOSIS — M25661 Stiffness of right knee, not elsewhere classified: Secondary | ICD-10-CM | POA: Diagnosis not present

## 2023-04-17 DIAGNOSIS — M25561 Pain in right knee: Secondary | ICD-10-CM | POA: Diagnosis not present

## 2023-04-17 DIAGNOSIS — Z96651 Presence of right artificial knee joint: Secondary | ICD-10-CM | POA: Diagnosis not present

## 2023-04-18 DIAGNOSIS — M419 Scoliosis, unspecified: Secondary | ICD-10-CM | POA: Diagnosis not present

## 2023-04-18 DIAGNOSIS — K5900 Constipation, unspecified: Secondary | ICD-10-CM | POA: Diagnosis not present

## 2023-04-18 DIAGNOSIS — R1084 Generalized abdominal pain: Secondary | ICD-10-CM | POA: Diagnosis not present

## 2023-04-18 DIAGNOSIS — R140 Abdominal distension (gaseous): Secondary | ICD-10-CM | POA: Diagnosis not present

## 2023-04-22 DIAGNOSIS — M25661 Stiffness of right knee, not elsewhere classified: Secondary | ICD-10-CM | POA: Diagnosis not present

## 2023-04-22 DIAGNOSIS — M25561 Pain in right knee: Secondary | ICD-10-CM | POA: Diagnosis not present

## 2023-04-22 DIAGNOSIS — M6281 Muscle weakness (generalized): Secondary | ICD-10-CM | POA: Diagnosis not present

## 2023-04-22 DIAGNOSIS — Z96651 Presence of right artificial knee joint: Secondary | ICD-10-CM | POA: Diagnosis not present

## 2023-04-25 DIAGNOSIS — M6281 Muscle weakness (generalized): Secondary | ICD-10-CM | POA: Diagnosis not present

## 2023-04-25 DIAGNOSIS — M25661 Stiffness of right knee, not elsewhere classified: Secondary | ICD-10-CM | POA: Diagnosis not present

## 2023-04-25 DIAGNOSIS — M25561 Pain in right knee: Secondary | ICD-10-CM | POA: Diagnosis not present

## 2023-04-25 DIAGNOSIS — Z96651 Presence of right artificial knee joint: Secondary | ICD-10-CM | POA: Diagnosis not present

## 2023-04-30 DIAGNOSIS — G8929 Other chronic pain: Secondary | ICD-10-CM | POA: Diagnosis not present

## 2023-04-30 DIAGNOSIS — M797 Fibromyalgia: Secondary | ICD-10-CM | POA: Diagnosis not present

## 2023-04-30 DIAGNOSIS — M170 Bilateral primary osteoarthritis of knee: Secondary | ICD-10-CM | POA: Diagnosis not present

## 2023-04-30 DIAGNOSIS — E7800 Pure hypercholesterolemia, unspecified: Secondary | ICD-10-CM | POA: Diagnosis not present

## 2023-04-30 DIAGNOSIS — N183 Chronic kidney disease, stage 3 unspecified: Secondary | ICD-10-CM | POA: Diagnosis not present

## 2023-04-30 DIAGNOSIS — M8580 Other specified disorders of bone density and structure, unspecified site: Secondary | ICD-10-CM | POA: Diagnosis not present

## 2023-05-01 DIAGNOSIS — Z96651 Presence of right artificial knee joint: Secondary | ICD-10-CM | POA: Diagnosis not present

## 2023-05-01 DIAGNOSIS — M25661 Stiffness of right knee, not elsewhere classified: Secondary | ICD-10-CM | POA: Diagnosis not present

## 2023-05-01 DIAGNOSIS — M6281 Muscle weakness (generalized): Secondary | ICD-10-CM | POA: Diagnosis not present

## 2023-05-01 DIAGNOSIS — M25561 Pain in right knee: Secondary | ICD-10-CM | POA: Diagnosis not present

## 2023-05-06 DIAGNOSIS — Z5189 Encounter for other specified aftercare: Secondary | ICD-10-CM | POA: Diagnosis not present

## 2023-07-08 DIAGNOSIS — R1084 Generalized abdominal pain: Secondary | ICD-10-CM | POA: Diagnosis not present

## 2023-07-30 ENCOUNTER — Encounter: Payer: Self-pay | Admitting: *Deleted

## 2023-08-04 ENCOUNTER — Ambulatory Visit: Payer: Medicare Other | Admitting: Neurology

## 2023-08-04 ENCOUNTER — Encounter: Payer: Self-pay | Admitting: Neurology

## 2023-08-04 VITALS — BP 120/72 | HR 78 | Ht 64.0 in | Wt 150.0 lb

## 2023-08-04 DIAGNOSIS — R0683 Snoring: Secondary | ICD-10-CM

## 2023-08-04 DIAGNOSIS — G2581 Restless legs syndrome: Secondary | ICD-10-CM | POA: Diagnosis not present

## 2023-08-04 NOTE — Progress Notes (Signed)
Subjective:    Patient ID: Wendy [name] is a 72 y.o. female.  HPI    Huston Foley, MD, PhD [facility] [address] [address] [place], [place] [address]  Dear Nehemiah Settle,  I saw your patient, Wendy [name], upon your kind request in my sleep clinic today for initial consultation of her sleep disorder, in particular, history of restless leg syndrome.  The patient is accompanied by her husband today.  As you know, Wendy [name] is a 72 year old female with an underlying medical history of anemia, chronic constipation, fibromyalgia, reflux disease, hyperlipidemia, irritable bowel syndrome, migraine headaches, osteopenia, rheumatoid arthritis, status post right total knee replacement, who reports a longstanding history of restless leg symptoms for over 30 years.  She reports that she is not quite sure why she is here today as her symptoms have improved and she feels stable at this point. I reviewed your office note from 04/30/2023.  She has been on clonazepam, currently 2 mg at bedtime and currently also takes gabapentin 300 mg at bedtime.  Of note, she takes amitriptyline 75 mg once daily for her fibromyalgia.  She is on tizanidine and Topamax as well.  She has tried pramipexole in the past, this is listed under allergies because of intolerance with nausea and vomiting reported.  Other intolerances include Lyrica with nausea and vomiting reported and trazodone with also nausea and vomiting reported.  Also listed for pramipexole is anxiety and angioedema as her intolerance/allergy.  She had blood work through your office including CMP, lipid panel, and thyroid studies.  I was able to review her blood test results from 04/30/2023.  Lipid panel showed total cholesterol elevated at 232, LDL elevated at 133, triglycerides 165.  CMP showed benign results.  TSH normal at 1.52.  She had additional blood work in April 2024, I was able to review test results from 12/26/2022.  CBC with  differential and platelets was benign, CMP benign with the exception of slightly reduced sodium at 134 at the time.  I did not see any recent iron studies.  She reports that she had exacerbation of her restless leg symptoms after her knee replacement surgery and her surgeon, Dr. Despina Hick had advised her that she could have worsening of her symptoms temporarily.  She has done well after her knee replacement surgery, she has completed rehab and walks without a walking aid.  She has not fallen recently.  She tries to hydrate well with water, estimates that she drinks about 6 bottles of water per day.  She drinks caffeine in limitation, about 2 cups of coffee in the morning.  She is a non-smoker.  Bedtime is generally around 9 PM and she falls asleep typically within an hour to an hour and a half.  She does have a TV in her bedroom and it tends to stay on all night, they keep it on the low volume.  They have 2 dogs in the household, the small dog sleeps on the bed with them and the other dog sleeps in the room but not on the bed.  She has been on gabapentin for many years.  It originally was given to her after she had her breast implants removed in the 90s.  She has been on sertraline low-dose for the past few years.  She reports increase in stress when they were taking care of her mother-in-law for a total of 8 or 9 years, her mother-in-law recently moved out to stay with her younger  son.  Patient does not drink any alcohol on a regular basis.  She denies any difficulty sleeping, no apneas reported, some snoring per husband.  She had a tonsillectomy at age 62.  Her Past Medical History Is Significant For: Past Medical History:  Diagnosis Date   Anemia    Anxiety    Chronic constipation    Family history of adverse reaction to anesthesia    Mother N&V   Fibromyalgia    GERD (gastroesophageal reflux disease)    Hypercholesteremia    IBS (irritable bowel syndrome)    Migraine    Osteopenia    Pneumonia     PONV (postoperative nausea and vomiting)    Restless leg syndrome    Rheumatoid arthritis(714.0)     Her Past Surgical History Is Significant For: Past Surgical History:  Procedure Laterality Date   ABDOMINAL ADHESION SURGERY     Oophorectomy   ABDOMINAL HYSTERECTOMY     BREAST SURGERY     breast implants and removal   CARDIAC CATHETERIZATION     2013   CARPAL TUNNEL RELEASE Right    CATARACT EXTRACTION W/ INTRAOCULAR LENS IMPLANT Bilateral    COLONOSCOPY     FOOT SURGERY     TOTAL KNEE ARTHROPLASTY Right 03/24/2023   Procedure: RIGHT TOTAL KNEE ARTHROPLASTY;  Surgeon: Ollen Gross, MD;  Location: [facility];  Service: Orthopedics;  Laterality: Right;    Her Family History Is Significant For: Family History  Problem Relation Age of Onset   Coronary artery disease Mother     Her Social History Is Significant For: Social History   Socioeconomic History   Marital status: Married    Spouse name: Not on file   Number of children: Not on file   Years of education: Not on file   Highest education level: Not on file  Occupational History   Not on file  Tobacco Use   Smoking status: Former    Types: Cigarettes   Smokeless tobacco: Never  Vaping Use   Vaping status: Never Used  Substance and Sexual Activity   Alcohol use: Never   Drug use: Never   Sexual activity: Not on file  Other Topics Concern   Not on file  Social History Narrative   Not on file   Social Determinants of Health   Financial Resource Strain: Not on file  Food Insecurity: Low Risk  (04/18/2023)   Received from [facility]   Hunger Vital Sign    Worried About Running Out of Food in the Last Year: Never true    Ran Out of Food in the Last Year: Never true  Transportation Needs: No Transportation Needs (04/18/2023)   Received from [facility]    In the past 12 months, has lack of reliable transportation kept you from medical appointments, meetings, work or from getting things  needed for daily living? : No  Physical Activity: Not on file  Stress: Not on file  Social Connections: Not on file    Her Allergies Are:  Allergies  Allergen Reactions   Guaifenesin Anaphylaxis   Pseudoephedrine Anaphylaxis   Codeine Nausea And Vomiting   Lyrica [Pregabalin] Nausea And Vomiting   Mirapex [Pramipexole Dihydrochloride] Nausea And Vomiting   Trazodone Nausea And Vomiting   Guaifenesin Er    Other Nausea And Vomiting    anesthesia   Pramipexole Anxiety    Other Reaction(s): Angioedema  :   Her Current Medications Are:  Outpatient Encounter Medications as of 08/04/2023  Medication Sig   amitriptyline (ELAVIL) 75 MG tablet Take 75 mg by mouth at bedtime.   ascorbic acid (VITAMIN C) 500 MG tablet Take 500 mg by mouth at bedtime.   aspirin EC 81 MG tablet Take 81 mg by mouth in the morning and at bedtime. Swallow whole.   beta carotene 32202 UNIT capsule Take 25,000 Units by mouth at bedtime.   bisacodyl (DULCOLAX) 5 MG EC tablet Take 5 mg by mouth daily as needed for mild constipation or moderate constipation.   calcium carbonate (OSCAL) 1500 (600 Ca) MG TABS tablet Take 600 mg of elemental calcium by mouth at bedtime.   cholecalciferol (VITAMIN D3) 25 MCG (1000 UNIT) tablet Take 1,000 Units by mouth daily.   clonazePAM (KLONOPIN) 2 MG tablet Take 2 mg by mouth at bedtime.   Cyanocobalamin (VITAMIN B-12) 5000 MCG SUBL Place 5,000 mcg under the tongue at bedtime.   ezetimibe (ZETIA) 10 MG tablet Take 10 mg by mouth at bedtime.   famotidine (PEPCID) 20 MG tablet Take 20 mg by mouth daily as needed for heartburn.   gabapentin (NEURONTIN) 300 MG capsule Take 300 mg by mouth at bedtime.   Garlic 1000 MG CAPS Take 1,000 mg by mouth at bedtime.   Ginger, Zingiber officinalis, (GINGER ROOT) 550 MG CAPS Take 550 mg by mouth daily.   Glucosamine HCl (GLUCOSAMINE PO) Take 2,000 mg by mouth at bedtime.   milk thistle 175 MG tablet Take 175 mg by mouth at bedtime.   Omega-3  Fatty Acids (FISH OIL PO) Take 500 mg by mouth at bedtime.   ondansetron (ZOFRAN) 4 MG tablet Take 1 tablet (4 mg total) by mouth every 6 (six) hours as needed for nausea.   OVER THE COUNTER MEDICATION Take 1,000 mg by mouth at bedtime. Marshmallow Root   Plant Sterols and Stanols (CHOLESTOFF PLUS) 450 MG CAPS Take 420 mg by mouth at bedtime.   Potassium 99 MG TABS Take 99 mg by mouth at bedtime.   RED YEAST RICE EXTRACT PO Take 1,200 mg by mouth at bedtime.   sertraline (ZOLOFT) 50 MG tablet Take 50 mg by mouth at bedtime.   tiZANidine (ZANAFLEX) 2 MG tablet Take 2 mg by mouth every 8 (eight) hours as needed for muscle spasms.   topiramate (TOPAMAX) 50 MG tablet Take 50 mg by mouth at bedtime.   TURMERIC CURCUMIN PO Take 1,500 mg by mouth at bedtime.   Zinc Gluconate 50 MG CAPS Take 50 mg by mouth at bedtime.   No facility-administered encounter medications on file as of 08/04/2023.  :   Review of Systems:  Out of a complete 14 point review of systems, all are reviewed and negative with the exception of these symptoms as listed below:  Review of Systems  Neurological:        Pt is here for Sleep consult to evaluate for RLS. States takes Klonopin for it.     Objective:  Neurological Exam  Physical Exam Physical Examination:   Vitals:   08/04/23 1453  BP: 120/72  Pulse: 78    General Examination: The patient is a very pleasant 72 y.o. female in no acute distress. She appears well-developed and well-nourished and well groomed.   HEENT: Normocephalic, atraumatic, pupils are equal, round and reactive to light, extraocular tracking is good without limitation to gaze excursion or nystagmus noted. Hearing is grossly intact. Face is symmetric with normal facial animation. Speech is clear with no dysarthria noted. There is no hypophonia. There  is no lip, neck/head, jaw or voice tremor. Neck is supple with full range of passive and active motion. There are no carotid bruits on  auscultation. Oropharynx exam reveals: moderate mouth dryness, adequate dental hygiene and mild airway crowding, due to small airway entry.  Neck circumference 14 inches.  Tongue protrudes centrally and palate elevates symmetrically.  Chest: Clear to auscultation without wheezing, rhonchi or crackles noted.  Heart: S1+S2+0, regular and normal without murmurs, rubs or gallops noted.   Abdomen: Soft, non-tender and non-distended.  Extremities: There is no obvious swelling in the distal lower extremities bilaterally.   Skin: Warm and dry without trophic changes noted.   Musculoskeletal: exam reveals no obvious joint deformities.   Neurologically:  Mental status: The patient is awake, alert and oriented in all 4 spheres. Her immediate and remote memory, attention, language skills and fund of knowledge are appropriate. There is no evidence of aphasia, agnosia, apraxia or anomia. Speech is clear with normal prosody and enunciation. Thought process is linear. Mood is normal and affect is normal.  Cranial nerves II - XII are as described above under HEENT exam.  Motor exam: Normal bulk, moving all 4 extremities without limitation, no obvious action or resting tremor.  Fine motor skills and coordination: grossly intact.  Cerebellar testing: No dysmetria or intention tremor. There is no truncal or gait ataxia.  Sensory exam: intact to light touch in the upper and lower extremities.  Gait, station and balance: She stands easily. No veering to one side is noted. No leaning to one side is noted. Posture is age-appropriate and stance is narrow based. Gait shows normal stride length and normal pace. No problems turning are noted.   Assessment and Plan:  In summary, Wendy [name] is a very pleasant 71 y.o.-year old female with an underlying medical history of anemia, chronic constipation, fibromyalgia, reflux disease, hyperlipidemia, irritable bowel syndrome, migraine headaches, osteopenia, rheumatoid  arthritis, status post right total knee replacement, who presents for evaluation of her restless leg syndrome of many years' duration.  She is currently on clonazepam 2 mg at bedtime.  She has been taking gabapentin 300 mg at bedtime and also takes sertraline and amitriptyline for other reasons, also takes Topamax for her migraine prevention.  We talked about restless leg syndrome at length today, she is advised that certain medications can exacerbate restless leg symptoms and leg twitching at night and these medications include amitriptyline and sertraline.  She feels that she may not need the sertraline any longer.  She was started on this when they were taking care of her mother-in-law who stayed with them for about 8 or 9 years and recently moved out to stay with her younger son.  She is advised to talk to you about potentially coming off of sertraline.  Amitriptyline can also exacerbate restless leg symptoms but she has been on it chronically and gabapentin she has been on for at least 20 years for more diffuse pain as I understand, originally was started for pain related to her breast implants.  She reports improvement of her restless leg symptoms after exacerbation following her knee replacement surgery.  She feels at baseline.  She is advised to follow-up with you as scheduled.  Her history and examination are not concerning for underlying sleep disordered breathing.  We mutually agreed not to proceed any sleep study at this time as she feels improved.  I answered all the questions today and the patient and her husband were in agreement.  Thank  you very much for allowing me to participate in the care of this nice patient. If I can be of any further assistance to you please do not hesitate to call me at [phone].  Sincerely,   Huston Foley, MD, PhD

## 2023-08-04 NOTE — Patient Instructions (Signed)
It was nice to meet you both today.  I am glad to hear, that your restless leg symptoms have improved and are stable at this time. You can follow-up with your primary care provider as scheduled and continue with your medication regimen through them.  Please note that are certain medications that can exacerbate restless leg symptoms and these medications include amitriptyline and sertraline.  You indicate that your stress has decreased and that you may not need to be on sertraline any longer.  You can address this with your primary care physician to see if you or able to taper off of sertraline at this time.

## 2023-08-22 DIAGNOSIS — Z8639 Personal history of other endocrine, nutritional and metabolic disease: Secondary | ICD-10-CM | POA: Diagnosis not present

## 2023-08-22 DIAGNOSIS — Z0000 Encounter for general adult medical examination without abnormal findings: Secondary | ICD-10-CM | POA: Diagnosis not present

## 2023-08-22 DIAGNOSIS — E7800 Pure hypercholesterolemia, unspecified: Secondary | ICD-10-CM | POA: Diagnosis not present

## 2023-08-22 DIAGNOSIS — M797 Fibromyalgia: Secondary | ICD-10-CM | POA: Diagnosis not present

## 2023-08-22 DIAGNOSIS — G8929 Other chronic pain: Secondary | ICD-10-CM | POA: Diagnosis not present

## 2023-08-22 DIAGNOSIS — M8580 Other specified disorders of bone density and structure, unspecified site: Secondary | ICD-10-CM | POA: Diagnosis not present

## 2023-08-22 DIAGNOSIS — M170 Bilateral primary osteoarthritis of knee: Secondary | ICD-10-CM | POA: Diagnosis not present

## 2023-08-22 DIAGNOSIS — J069 Acute upper respiratory infection, unspecified: Secondary | ICD-10-CM | POA: Diagnosis not present

## 2023-08-22 DIAGNOSIS — G2581 Restless legs syndrome: Secondary | ICD-10-CM | POA: Diagnosis not present

## 2023-09-17 DIAGNOSIS — Z860100 Personal history of colon polyps, unspecified: Secondary | ICD-10-CM | POA: Diagnosis not present

## 2023-09-17 DIAGNOSIS — R1084 Generalized abdominal pain: Secondary | ICD-10-CM | POA: Diagnosis not present

## 2023-09-17 DIAGNOSIS — R109 Unspecified abdominal pain: Secondary | ICD-10-CM | POA: Diagnosis not present

## 2023-09-17 DIAGNOSIS — K5904 Chronic idiopathic constipation: Secondary | ICD-10-CM | POA: Diagnosis not present

## 2023-09-18 DIAGNOSIS — N1330 Unspecified hydronephrosis: Secondary | ICD-10-CM | POA: Diagnosis not present

## 2023-09-18 DIAGNOSIS — R109 Unspecified abdominal pain: Secondary | ICD-10-CM | POA: Diagnosis not present

## 2023-09-19 DIAGNOSIS — M79604 Pain in right leg: Secondary | ICD-10-CM | POA: Diagnosis not present

## 2023-09-19 DIAGNOSIS — R030 Elevated blood-pressure reading, without diagnosis of hypertension: Secondary | ICD-10-CM | POA: Diagnosis not present

## 2023-09-19 DIAGNOSIS — M5414 Radiculopathy, thoracic region: Secondary | ICD-10-CM | POA: Diagnosis not present

## 2023-09-19 DIAGNOSIS — M47816 Spondylosis without myelopathy or radiculopathy, lumbar region: Secondary | ICD-10-CM | POA: Diagnosis not present

## 2023-09-19 DIAGNOSIS — K5900 Constipation, unspecified: Secondary | ICD-10-CM | POA: Diagnosis not present

## 2023-09-19 DIAGNOSIS — Z9889 Other specified postprocedural states: Secondary | ICD-10-CM | POA: Diagnosis not present

## 2023-09-19 DIAGNOSIS — M5416 Radiculopathy, lumbar region: Secondary | ICD-10-CM | POA: Diagnosis not present

## 2023-09-19 DIAGNOSIS — R109 Unspecified abdominal pain: Secondary | ICD-10-CM | POA: Diagnosis not present

## 2023-09-19 DIAGNOSIS — M549 Dorsalgia, unspecified: Secondary | ICD-10-CM | POA: Diagnosis not present

## 2023-09-19 DIAGNOSIS — Z96651 Presence of right artificial knee joint: Secondary | ICD-10-CM | POA: Diagnosis not present

## 2023-09-19 DIAGNOSIS — Z79899 Other long term (current) drug therapy: Secondary | ICD-10-CM | POA: Diagnosis not present

## 2023-09-19 DIAGNOSIS — M47814 Spondylosis without myelopathy or radiculopathy, thoracic region: Secondary | ICD-10-CM | POA: Diagnosis not present

## 2023-09-19 DIAGNOSIS — K5909 Other constipation: Secondary | ICD-10-CM | POA: Diagnosis not present

## 2023-09-19 DIAGNOSIS — R110 Nausea: Secondary | ICD-10-CM | POA: Diagnosis not present

## 2023-09-19 DIAGNOSIS — M419 Scoliosis, unspecified: Secondary | ICD-10-CM | POA: Diagnosis not present

## 2023-09-19 DIAGNOSIS — M4185 Other forms of scoliosis, thoracolumbar region: Secondary | ICD-10-CM | POA: Diagnosis not present

## 2023-09-19 DIAGNOSIS — Z7982 Long term (current) use of aspirin: Secondary | ICD-10-CM | POA: Diagnosis not present

## 2023-09-19 DIAGNOSIS — R1031 Right lower quadrant pain: Secondary | ICD-10-CM | POA: Diagnosis not present

## 2023-09-19 DIAGNOSIS — M4316 Spondylolisthesis, lumbar region: Secondary | ICD-10-CM | POA: Diagnosis not present

## 2023-10-02 DIAGNOSIS — M5136 Other intervertebral disc degeneration, lumbar region with discogenic back pain only: Secondary | ICD-10-CM | POA: Diagnosis not present

## 2023-10-02 DIAGNOSIS — M5126 Other intervertebral disc displacement, lumbar region: Secondary | ICD-10-CM | POA: Diagnosis not present

## 2023-10-02 DIAGNOSIS — M4724 Other spondylosis with radiculopathy, thoracic region: Secondary | ICD-10-CM | POA: Diagnosis not present

## 2023-10-02 DIAGNOSIS — M5416 Radiculopathy, lumbar region: Secondary | ICD-10-CM | POA: Diagnosis not present

## 2023-10-02 DIAGNOSIS — M5114 Intervertebral disc disorders with radiculopathy, thoracic region: Secondary | ICD-10-CM | POA: Diagnosis not present

## 2023-10-02 DIAGNOSIS — M47816 Spondylosis without myelopathy or radiculopathy, lumbar region: Secondary | ICD-10-CM | POA: Diagnosis not present

## 2023-10-02 DIAGNOSIS — M549 Dorsalgia, unspecified: Secondary | ICD-10-CM | POA: Diagnosis not present

## 2023-10-02 DIAGNOSIS — M47817 Spondylosis without myelopathy or radiculopathy, lumbosacral region: Secondary | ICD-10-CM | POA: Diagnosis not present

## 2023-10-02 DIAGNOSIS — M5414 Radiculopathy, thoracic region: Secondary | ICD-10-CM | POA: Diagnosis not present

## 2023-10-10 DIAGNOSIS — R109 Unspecified abdominal pain: Secondary | ICD-10-CM | POA: Diagnosis not present

## 2023-10-10 DIAGNOSIS — M5414 Radiculopathy, thoracic region: Secondary | ICD-10-CM | POA: Diagnosis not present

## 2023-10-30 DIAGNOSIS — K5909 Other constipation: Secondary | ICD-10-CM | POA: Diagnosis not present

## 2023-10-30 DIAGNOSIS — R1013 Epigastric pain: Secondary | ICD-10-CM | POA: Diagnosis not present

## 2023-10-30 DIAGNOSIS — K660 Peritoneal adhesions (postprocedural) (postinfection): Secondary | ICD-10-CM | POA: Diagnosis not present

## 2023-10-31 DIAGNOSIS — R6881 Early satiety: Secondary | ICD-10-CM | POA: Diagnosis not present

## 2023-10-31 DIAGNOSIS — R140 Abdominal distension (gaseous): Secondary | ICD-10-CM | POA: Diagnosis not present

## 2023-11-13 DIAGNOSIS — R140 Abdominal distension (gaseous): Secondary | ICD-10-CM | POA: Diagnosis not present

## 2023-11-13 DIAGNOSIS — K660 Peritoneal adhesions (postprocedural) (postinfection): Secondary | ICD-10-CM | POA: Diagnosis not present

## 2023-11-13 DIAGNOSIS — R1084 Generalized abdominal pain: Secondary | ICD-10-CM | POA: Diagnosis not present

## 2023-11-13 DIAGNOSIS — R109 Unspecified abdominal pain: Secondary | ICD-10-CM | POA: Diagnosis not present

## 2023-11-26 DIAGNOSIS — R1084 Generalized abdominal pain: Secondary | ICD-10-CM | POA: Diagnosis not present

## 2023-11-26 DIAGNOSIS — G8929 Other chronic pain: Secondary | ICD-10-CM | POA: Diagnosis not present

## 2023-12-04 DIAGNOSIS — Z1231 Encounter for screening mammogram for malignant neoplasm of breast: Secondary | ICD-10-CM | POA: Diagnosis not present

## 2023-12-10 DIAGNOSIS — R140 Abdominal distension (gaseous): Secondary | ICD-10-CM | POA: Diagnosis not present

## 2023-12-10 DIAGNOSIS — R1084 Generalized abdominal pain: Secondary | ICD-10-CM | POA: Diagnosis not present

## 2023-12-10 DIAGNOSIS — K5900 Constipation, unspecified: Secondary | ICD-10-CM | POA: Diagnosis not present

## 2023-12-15 DIAGNOSIS — R140 Abdominal distension (gaseous): Secondary | ICD-10-CM | POA: Diagnosis not present

## 2023-12-15 DIAGNOSIS — E639 Nutritional deficiency, unspecified: Secondary | ICD-10-CM | POA: Diagnosis not present

## 2023-12-17 DIAGNOSIS — R1084 Generalized abdominal pain: Secondary | ICD-10-CM | POA: Diagnosis not present

## 2023-12-17 DIAGNOSIS — K5900 Constipation, unspecified: Secondary | ICD-10-CM | POA: Diagnosis not present

## 2023-12-17 DIAGNOSIS — K760 Fatty (change of) liver, not elsewhere classified: Secondary | ICD-10-CM | POA: Diagnosis not present

## 2023-12-17 DIAGNOSIS — R109 Unspecified abdominal pain: Secondary | ICD-10-CM | POA: Diagnosis not present

## 2023-12-17 DIAGNOSIS — R140 Abdominal distension (gaseous): Secondary | ICD-10-CM | POA: Diagnosis not present

## 2024-01-23 DIAGNOSIS — K5909 Other constipation: Secondary | ICD-10-CM | POA: Diagnosis not present

## 2024-01-23 DIAGNOSIS — K5904 Chronic idiopathic constipation: Secondary | ICD-10-CM | POA: Diagnosis not present

## 2024-01-23 DIAGNOSIS — K660 Peritoneal adhesions (postprocedural) (postinfection): Secondary | ICD-10-CM | POA: Diagnosis not present

## 2024-01-23 DIAGNOSIS — R638 Other symptoms and signs concerning food and fluid intake: Secondary | ICD-10-CM | POA: Diagnosis not present

## 2024-01-23 DIAGNOSIS — R110 Nausea: Secondary | ICD-10-CM | POA: Diagnosis not present

## 2024-02-16 DIAGNOSIS — E639 Nutritional deficiency, unspecified: Secondary | ICD-10-CM | POA: Diagnosis not present

## 2024-02-16 DIAGNOSIS — R1084 Generalized abdominal pain: Secondary | ICD-10-CM | POA: Diagnosis not present

## 2024-02-16 DIAGNOSIS — K5909 Other constipation: Secondary | ICD-10-CM | POA: Diagnosis not present

## 2024-02-16 DIAGNOSIS — G8929 Other chronic pain: Secondary | ICD-10-CM | POA: Diagnosis not present

## 2024-02-16 DIAGNOSIS — R140 Abdominal distension (gaseous): Secondary | ICD-10-CM | POA: Diagnosis not present

## 2024-02-16 DIAGNOSIS — R5383 Other fatigue: Secondary | ICD-10-CM | POA: Diagnosis not present

## 2024-04-27 DIAGNOSIS — M170 Bilateral primary osteoarthritis of knee: Secondary | ICD-10-CM | POA: Diagnosis not present

## 2024-04-27 DIAGNOSIS — E7800 Pure hypercholesterolemia, unspecified: Secondary | ICD-10-CM | POA: Diagnosis not present

## 2024-04-27 DIAGNOSIS — K660 Peritoneal adhesions (postprocedural) (postinfection): Secondary | ICD-10-CM | POA: Diagnosis not present

## 2024-04-27 DIAGNOSIS — M8580 Other specified disorders of bone density and structure, unspecified site: Secondary | ICD-10-CM | POA: Diagnosis not present

## 2024-04-27 DIAGNOSIS — G2581 Restless legs syndrome: Secondary | ICD-10-CM | POA: Diagnosis not present

## 2024-04-27 DIAGNOSIS — G43909 Migraine, unspecified, not intractable, without status migrainosus: Secondary | ICD-10-CM | POA: Diagnosis not present

## 2024-04-27 DIAGNOSIS — M797 Fibromyalgia: Secondary | ICD-10-CM | POA: Diagnosis not present

## 2024-04-27 DIAGNOSIS — G8929 Other chronic pain: Secondary | ICD-10-CM | POA: Diagnosis not present

## 2024-04-27 DIAGNOSIS — R1084 Generalized abdominal pain: Secondary | ICD-10-CM | POA: Diagnosis not present

## 2024-05-17 DIAGNOSIS — K660 Peritoneal adhesions (postprocedural) (postinfection): Secondary | ICD-10-CM | POA: Diagnosis not present

## 2024-05-17 DIAGNOSIS — K5909 Other constipation: Secondary | ICD-10-CM | POA: Diagnosis not present

## 2024-06-16 DIAGNOSIS — R1033 Periumbilical pain: Secondary | ICD-10-CM | POA: Diagnosis not present

## 2024-06-16 DIAGNOSIS — R112 Nausea with vomiting, unspecified: Secondary | ICD-10-CM | POA: Diagnosis not present

## 2024-06-16 DIAGNOSIS — R1013 Epigastric pain: Secondary | ICD-10-CM | POA: Diagnosis not present

## 2024-06-16 DIAGNOSIS — R634 Abnormal weight loss: Secondary | ICD-10-CM | POA: Diagnosis not present

## 2024-06-16 DIAGNOSIS — R1030 Lower abdominal pain, unspecified: Secondary | ICD-10-CM | POA: Diagnosis not present

## 2024-06-16 DIAGNOSIS — R140 Abdominal distension (gaseous): Secondary | ICD-10-CM | POA: Diagnosis not present

## 2024-06-16 DIAGNOSIS — R194 Change in bowel habit: Secondary | ICD-10-CM | POA: Diagnosis not present

## 2024-06-22 DIAGNOSIS — R1084 Generalized abdominal pain: Secondary | ICD-10-CM | POA: Diagnosis not present

## 2024-06-22 DIAGNOSIS — Z8719 Personal history of other diseases of the digestive system: Secondary | ICD-10-CM | POA: Diagnosis not present

## 2024-06-22 DIAGNOSIS — K449 Diaphragmatic hernia without obstruction or gangrene: Secondary | ICD-10-CM | POA: Diagnosis not present

## 2024-06-22 DIAGNOSIS — K5900 Constipation, unspecified: Secondary | ICD-10-CM | POA: Diagnosis not present
